# Patient Record
Sex: Female | Born: 1970 | Race: White | Hispanic: No | Marital: Married | Smoking: Never smoker
Health system: Southern US, Community
[De-identification: ages and names within clinical notes are randomized; demographics above are authoritative.]

## PROBLEM LIST (undated history)

## (undated) DIAGNOSIS — E039 Hypothyroidism, unspecified: Secondary | ICD-10-CM

## (undated) DIAGNOSIS — Z8742 Personal history of other diseases of the female genital tract: Secondary | ICD-10-CM

## (undated) DIAGNOSIS — N6009 Solitary cyst of unspecified breast: Secondary | ICD-10-CM

## (undated) HISTORY — DX: Personal history of other diseases of the female genital tract: Z87.42

## (undated) HISTORY — PX: APPENDECTOMY: SHX54

## (undated) HISTORY — DX: Hypothyroidism, unspecified: E03.9

## (undated) HISTORY — DX: Solitary cyst of unspecified breast: N60.09

---

## 2000-08-05 ENCOUNTER — Encounter: Payer: Self-pay | Admitting: Neurosurgery

## 2000-08-05 ENCOUNTER — Ambulatory Visit (HOSPITAL_COMMUNITY): Admission: RE | Admit: 2000-08-05 | Discharge: 2000-08-05 | Payer: Self-pay | Admitting: Neurosurgery

## 2006-12-06 ENCOUNTER — Encounter: Admission: RE | Admit: 2006-12-06 | Discharge: 2006-12-06 | Payer: Self-pay | Admitting: Obstetrics & Gynecology

## 2007-06-11 ENCOUNTER — Ambulatory Visit (HOSPITAL_COMMUNITY): Admission: RE | Admit: 2007-06-11 | Discharge: 2007-06-11 | Payer: Self-pay | Admitting: Obstetrics & Gynecology

## 2008-01-14 ENCOUNTER — Encounter: Admission: RE | Admit: 2008-01-14 | Discharge: 2008-01-14 | Payer: Self-pay | Admitting: Obstetrics & Gynecology

## 2008-01-18 ENCOUNTER — Other Ambulatory Visit: Admission: RE | Admit: 2008-01-18 | Discharge: 2008-01-18 | Payer: Self-pay | Admitting: Obstetrics and Gynecology

## 2008-01-23 ENCOUNTER — Ambulatory Visit (HOSPITAL_COMMUNITY): Admission: RE | Admit: 2008-01-23 | Discharge: 2008-01-23 | Payer: Self-pay | Admitting: Obstetrics & Gynecology

## 2008-06-02 ENCOUNTER — Encounter: Admission: RE | Admit: 2008-06-02 | Discharge: 2008-06-02 | Payer: Self-pay | Admitting: Obstetrics & Gynecology

## 2008-09-29 ENCOUNTER — Ambulatory Visit (HOSPITAL_COMMUNITY): Admission: RE | Admit: 2008-09-29 | Discharge: 2008-09-29 | Payer: Self-pay | Admitting: Obstetrics & Gynecology

## 2009-03-30 ENCOUNTER — Ambulatory Visit (HOSPITAL_COMMUNITY): Admission: RE | Admit: 2009-03-30 | Discharge: 2009-03-30 | Payer: Self-pay | Admitting: Endocrinology

## 2009-04-09 ENCOUNTER — Ambulatory Visit (HOSPITAL_COMMUNITY): Admission: RE | Admit: 2009-04-09 | Discharge: 2009-04-09 | Payer: Self-pay | Admitting: Obstetrics & Gynecology

## 2009-04-09 ENCOUNTER — Other Ambulatory Visit: Admission: RE | Admit: 2009-04-09 | Discharge: 2009-04-09 | Payer: Self-pay | Admitting: Obstetrics and Gynecology

## 2009-04-15 ENCOUNTER — Ambulatory Visit (HOSPITAL_COMMUNITY): Admission: RE | Admit: 2009-04-15 | Discharge: 2009-04-15 | Payer: Self-pay | Admitting: Pulmonary Disease

## 2009-05-13 ENCOUNTER — Ambulatory Visit (HOSPITAL_COMMUNITY): Admission: RE | Admit: 2009-05-13 | Discharge: 2009-05-13 | Payer: Self-pay | Admitting: Pulmonary Disease

## 2009-06-03 ENCOUNTER — Encounter: Admission: RE | Admit: 2009-06-03 | Discharge: 2009-06-03 | Payer: Self-pay | Admitting: Obstetrics & Gynecology

## 2010-02-24 ENCOUNTER — Encounter: Admission: RE | Admit: 2010-02-24 | Discharge: 2010-02-24 | Payer: Self-pay | Admitting: Endocrinology

## 2010-04-30 ENCOUNTER — Other Ambulatory Visit: Admission: RE | Admit: 2010-04-30 | Discharge: 2010-04-30 | Payer: Self-pay | Admitting: Obstetrics and Gynecology

## 2010-06-03 ENCOUNTER — Encounter (INDEPENDENT_AMBULATORY_CARE_PROVIDER_SITE_OTHER): Payer: Self-pay | Admitting: Cardiology

## 2010-06-03 ENCOUNTER — Ambulatory Visit (HOSPITAL_COMMUNITY): Admission: RE | Admit: 2010-06-03 | Discharge: 2010-06-03 | Payer: Self-pay | Admitting: Cardiology

## 2010-07-13 ENCOUNTER — Encounter: Admission: RE | Admit: 2010-07-13 | Discharge: 2010-07-13 | Payer: Self-pay | Admitting: Obstetrics & Gynecology

## 2010-09-06 ENCOUNTER — Encounter: Payer: Self-pay | Admitting: Endocrinology

## 2010-11-19 LAB — BLOOD GAS, ARTERIAL
Bicarbonate: 22.5 mEq/L (ref 20.0–24.0)
FIO2: 21 %
O2 Content: 21 L/min
O2 Saturation: 97.1 %
TCO2: 19.9 mmol/L (ref 0–100)
pCO2 arterial: 34.8 mmHg — ABNORMAL LOW (ref 35.0–45.0)
pH, Arterial: 7.427 — ABNORMAL HIGH (ref 7.350–7.400)
pO2, Arterial: 89 mmHg (ref 80.0–100.0)

## 2010-12-31 NOTE — Procedures (Signed)
NAMESARENA, JEZEK               ACCOUNT NO.:  [id number]   MEDICAL RECORD NO.:  [id number]          PATIENT TYPE:  OUT   LOCATION:  RESP                          FACILITY:  [facility]   PHYSICIAN:  Edward L. Juanetta Gosling, M.D.DATE OF BIRTH:  May 02, 1971   DATE OF PROCEDURE:  DATE OF DISCHARGE:                            PULMONARY FUNCTION TEST   1. Spirometry shows minimal airflow obstruction in the smaller      airways.  There is no ventilatory defect.  2. Lung volumes are normal.  3. DLCO is normal.  4. Arterial blood gases normal.      Edward L. Juanetta Gosling, M.D.  Electronically Signed     [facility]/MEDQ  D:  04/16/2009  T:  04/16/2009  Job:  [id number]   cc:   Cyril Mourning, NP  [facility]

## 2011-03-08 ENCOUNTER — Other Ambulatory Visit (HOSPITAL_COMMUNITY): Payer: Self-pay | Admitting: Family Medicine

## 2011-03-08 DIAGNOSIS — E042 Nontoxic multinodular goiter: Secondary | ICD-10-CM

## 2011-03-08 DIAGNOSIS — K829 Disease of gallbladder, unspecified: Secondary | ICD-10-CM

## 2011-03-14 ENCOUNTER — Ambulatory Visit (HOSPITAL_COMMUNITY)
Admission: RE | Admit: 2011-03-14 | Discharge: 2011-03-14 | Disposition: A | Discharge status: Home / Self Care | Payer: BC Managed Care – PPO | Source: Ambulatory Visit | Attending: Family Medicine | Admitting: Family Medicine

## 2011-03-14 DIAGNOSIS — R1011 Right upper quadrant pain: Secondary | ICD-10-CM | POA: Insufficient documentation

## 2011-03-14 DIAGNOSIS — K829 Disease of gallbladder, unspecified: Secondary | ICD-10-CM

## 2011-03-14 DIAGNOSIS — E042 Nontoxic multinodular goiter: Secondary | ICD-10-CM

## 2011-03-14 DIAGNOSIS — K824 Cholesterolosis of gallbladder: Secondary | ICD-10-CM | POA: Insufficient documentation

## 2011-03-15 ENCOUNTER — Other Ambulatory Visit: Payer: Self-pay | Admitting: Obstetrics & Gynecology

## 2011-03-15 DIAGNOSIS — N644 Mastodynia: Secondary | ICD-10-CM

## 2011-03-22 ENCOUNTER — Ambulatory Visit
Admission: RE | Admit: 2011-03-22 | Discharge: 2011-03-22 | Disposition: A | Discharge status: Home / Self Care | Payer: BC Managed Care – PPO | Source: Ambulatory Visit | Attending: Obstetrics & Gynecology | Admitting: Obstetrics & Gynecology

## 2011-03-22 DIAGNOSIS — N644 Mastodynia: Secondary | ICD-10-CM

## 2011-05-02 ENCOUNTER — Other Ambulatory Visit: Payer: Self-pay | Admitting: Adult Health

## 2011-05-11 ENCOUNTER — Other Ambulatory Visit (HOSPITAL_COMMUNITY)
Admission: RE | Admit: 2011-05-11 | Discharge: 2011-05-11 | Disposition: A | Discharge status: Home / Self Care | Payer: BC Managed Care – PPO | Source: Ambulatory Visit | Attending: Obstetrics and Gynecology | Admitting: Obstetrics and Gynecology

## 2011-05-11 ENCOUNTER — Other Ambulatory Visit: Payer: Self-pay | Admitting: Adult Health

## 2011-07-04 ENCOUNTER — Other Ambulatory Visit: Payer: Self-pay | Admitting: Obstetrics & Gynecology

## 2011-07-04 DIAGNOSIS — Z1231 Encounter for screening mammogram for malignant neoplasm of breast: Secondary | ICD-10-CM

## 2011-07-10 IMAGING — CT CT CHEST W/O CM
2 of 4 series · 15 of 36 positions shown, 18 images · non-contrast
Comparison: Chest radiograph 04/09/2009

CLINICAL DATA: Chest pain.  Chronic cough.  History sinusitis.

CT CHEST WITHOUT CONTRAST
TECHNIQUE: Multidetector CT imaging of the chest was performed
following the standard protocol without IV contrast.

[Series 3: chestroutine 5.0 b40f · axial · 0.61mm/px · z∈[+16,+312]mm · 12 of 65 slices shown, 15 images]
[im 3/65  mediastinal]
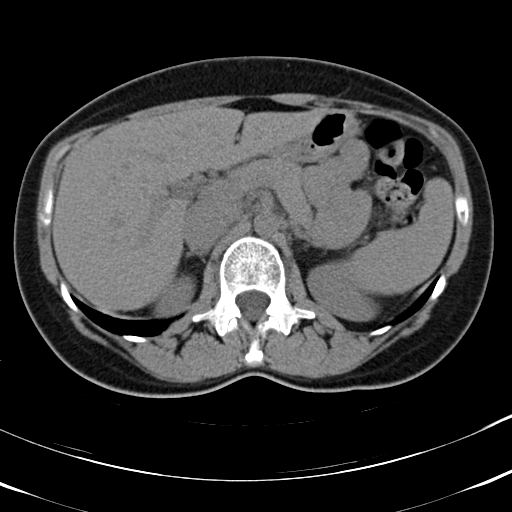
[im 3/65  lung]
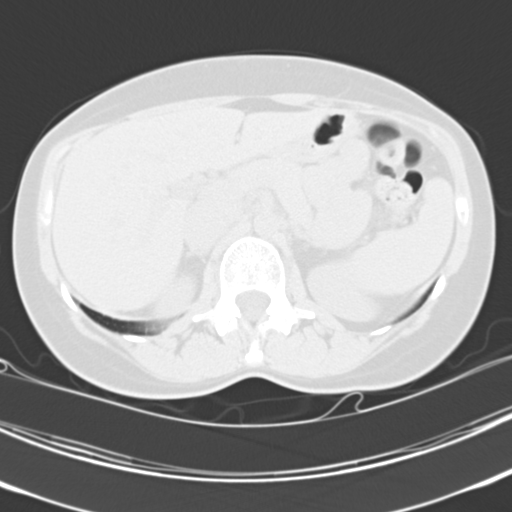
[im 9/65  lung]
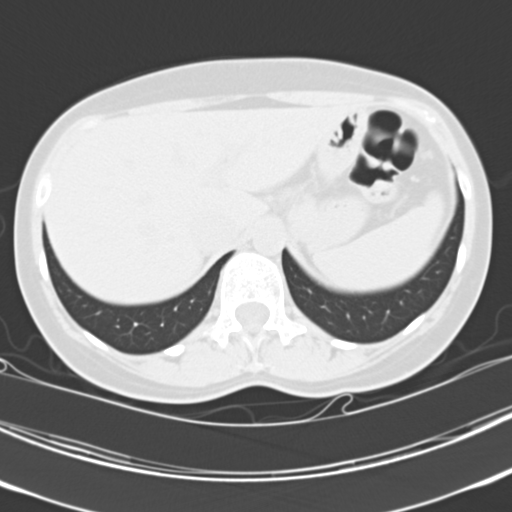
[im 14/65  lung]
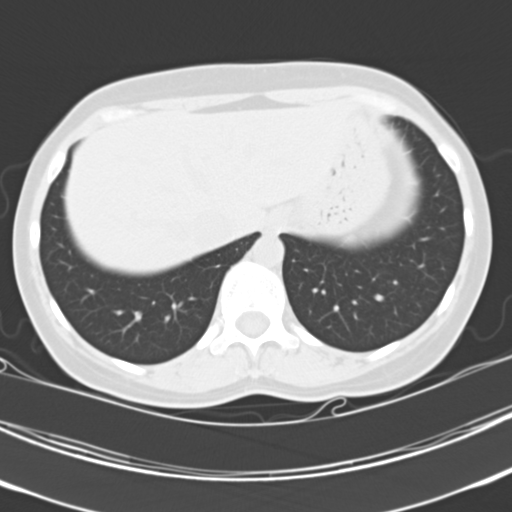
[im 20/65  lung]
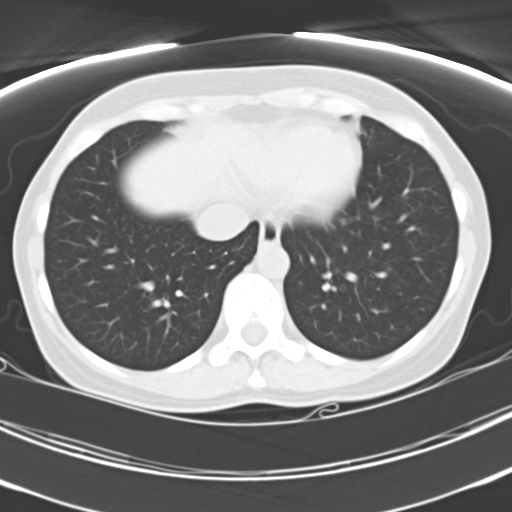
[im 26/65  mediastinal]
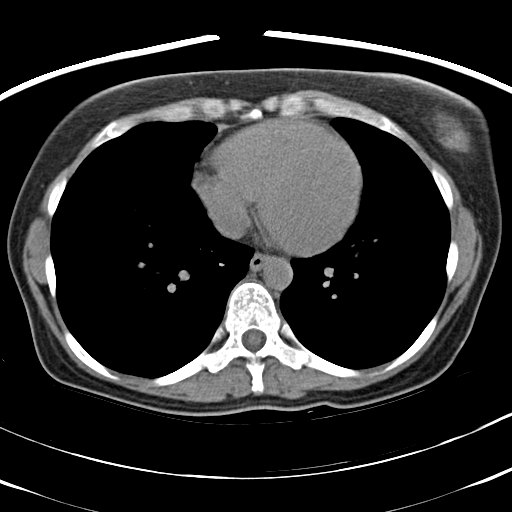
[im 26/65  lung]
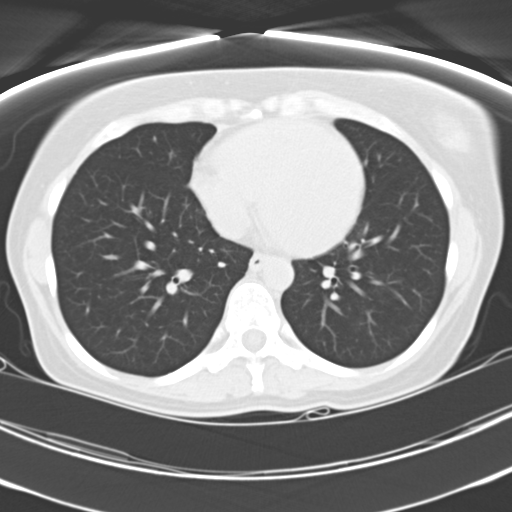
[im 31/65  lung]
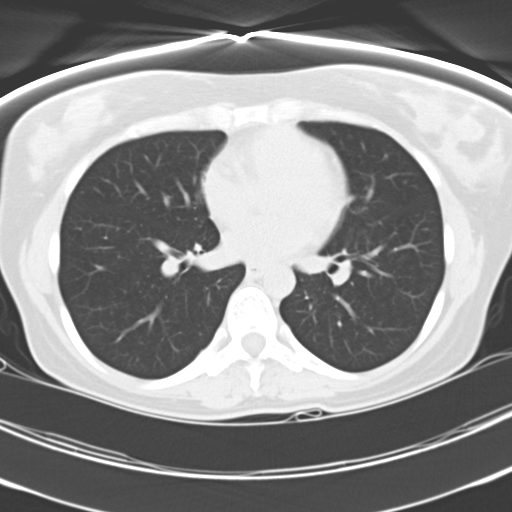
[im 34/65  lung]
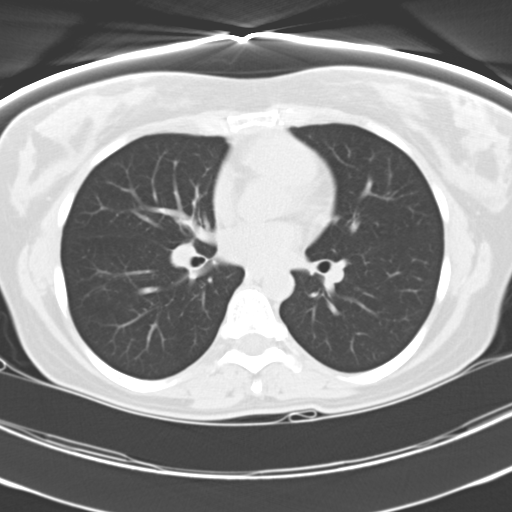
[im 39/65  lung]
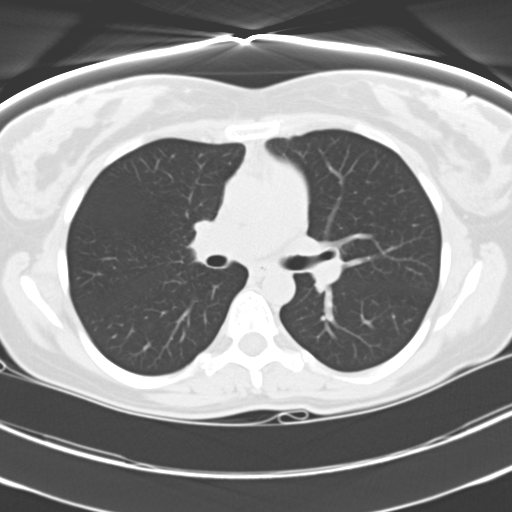
[im 45/65  mediastinal]
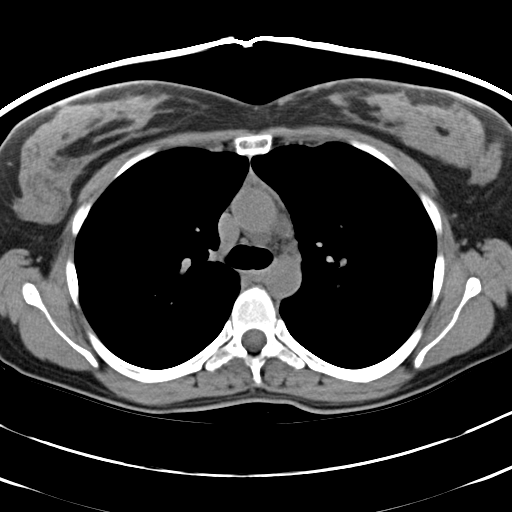
[im 45/65  lung]
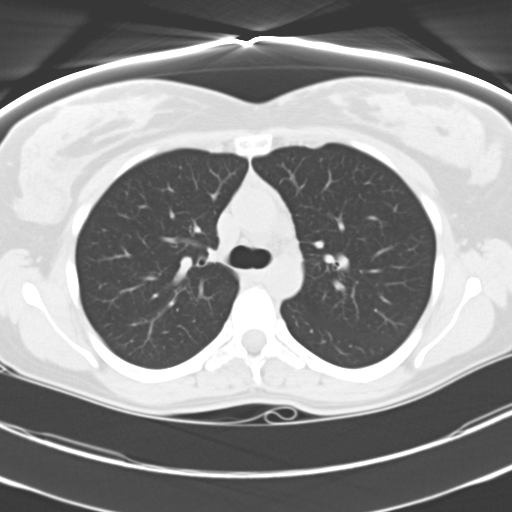
[im 51/65  lung]
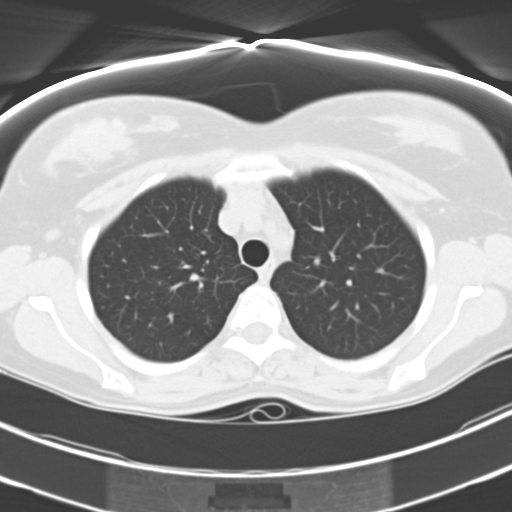
[im 56/65  lung]
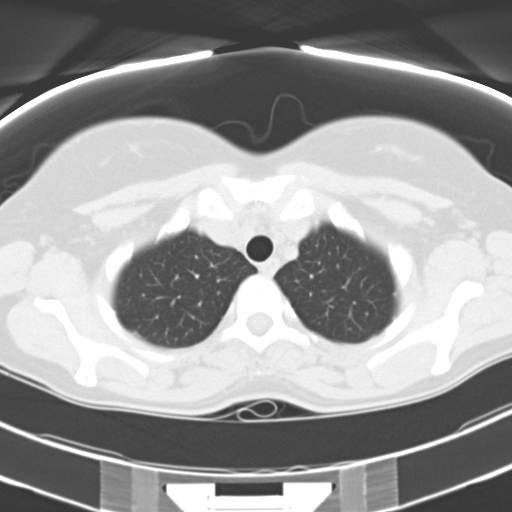
[im 62/65  lung]
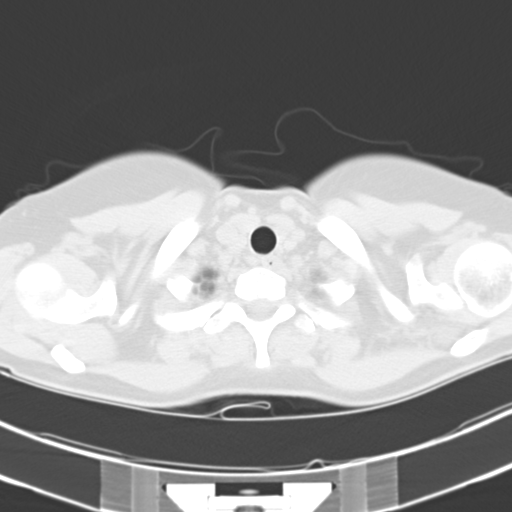

[Series 5: mpr coro 3mm · coronal · 0.61mm/px · 3 of 67 slices shown]
[im 14/67  lung]
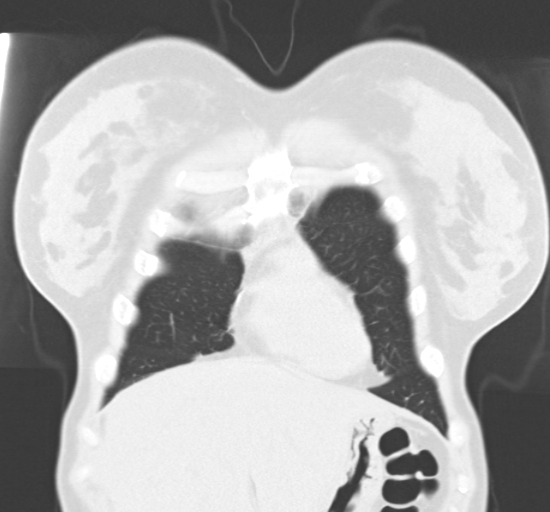
[im 27/67  lung]
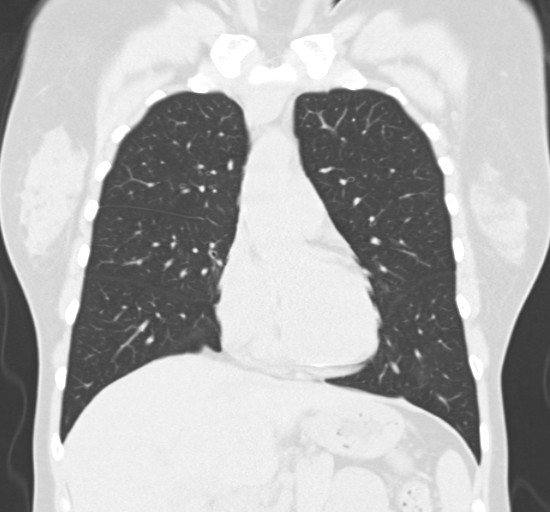
[im 40/67  lung]
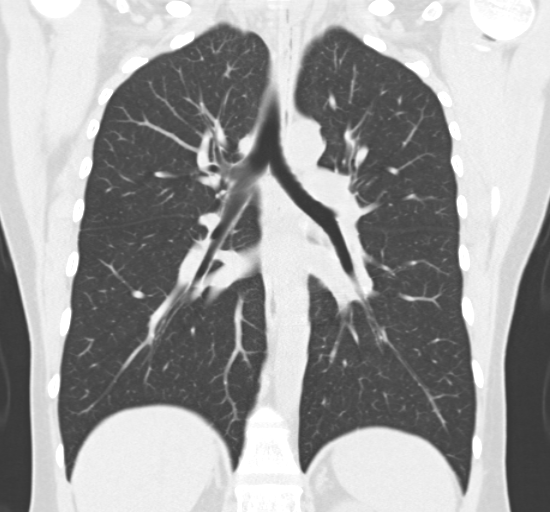

[15 of 36 positions shown; findings below may reference images not displayed]

FINDINGS: No pulmonary nodules or masses.  No infiltrates.  No
evidence for interstitial or airspace disease.  Slightly prominent
sized lymph nodes in the right axilla.  There are no nodes which
are pathologically enlarged.  No mediastinal adenopathy.  No
bronchiectasis
IMPRESSION: Unremarkable CT chest.  See comments above.

## 2011-08-01 ENCOUNTER — Ambulatory Visit: Payer: BC Managed Care – PPO

## 2011-08-23 ENCOUNTER — Ambulatory Visit
Admission: RE | Admit: 2011-08-23 | Discharge: 2011-08-23 | Disposition: A | Discharge status: Home / Self Care | Payer: BC Managed Care – PPO | Source: Ambulatory Visit | Attending: Obstetrics & Gynecology | Admitting: Obstetrics & Gynecology

## 2011-08-23 DIAGNOSIS — Z1231 Encounter for screening mammogram for malignant neoplasm of breast: Secondary | ICD-10-CM

## 2012-09-10 ENCOUNTER — Other Ambulatory Visit: Payer: Self-pay | Admitting: Obstetrics & Gynecology

## 2012-09-10 DIAGNOSIS — Z1231 Encounter for screening mammogram for malignant neoplasm of breast: Secondary | ICD-10-CM

## 2012-10-03 ENCOUNTER — Ambulatory Visit: Payer: BC Managed Care – PPO

## 2012-10-09 ENCOUNTER — Ambulatory Visit
Admission: RE | Admit: 2012-10-09 | Discharge: 2012-10-09 | Disposition: A | Discharge status: Home / Self Care | Payer: BC Managed Care – PPO | Source: Ambulatory Visit | Attending: Obstetrics & Gynecology | Admitting: Obstetrics & Gynecology

## 2012-10-09 DIAGNOSIS — Z1231 Encounter for screening mammogram for malignant neoplasm of breast: Secondary | ICD-10-CM

## 2012-12-05 ENCOUNTER — Encounter: Payer: Self-pay | Admitting: *Deleted

## 2012-12-05 DIAGNOSIS — E039 Hypothyroidism, unspecified: Secondary | ICD-10-CM

## 2012-12-06 ENCOUNTER — Ambulatory Visit (INDEPENDENT_AMBULATORY_CARE_PROVIDER_SITE_OTHER): Payer: BC Managed Care – PPO | Admitting: Adult Health

## 2012-12-06 ENCOUNTER — Other Ambulatory Visit (HOSPITAL_COMMUNITY)
Admission: RE | Admit: 2012-12-06 | Discharge: 2012-12-06 | Disposition: A | Discharge status: Home / Self Care | Payer: BC Managed Care – PPO | Source: Ambulatory Visit | Attending: Adult Health | Admitting: Adult Health

## 2012-12-06 ENCOUNTER — Encounter: Payer: Self-pay | Admitting: Adult Health

## 2012-12-06 VITALS — BP 112/70 | HR 72 | Ht 63.0 in | Wt 128.0 lb

## 2012-12-06 DIAGNOSIS — Z1212 Encounter for screening for malignant neoplasm of rectum: Secondary | ICD-10-CM

## 2012-12-06 DIAGNOSIS — Z01419 Encounter for gynecological examination (general) (routine) without abnormal findings: Secondary | ICD-10-CM | POA: Insufficient documentation

## 2012-12-06 DIAGNOSIS — Z0000 Encounter for general adult medical examination without abnormal findings: Secondary | ICD-10-CM

## 2012-12-06 DIAGNOSIS — E039 Hypothyroidism, unspecified: Secondary | ICD-10-CM

## 2012-12-06 DIAGNOSIS — R87810 Cervical high risk human papillomavirus (HPV) DNA test positive: Secondary | ICD-10-CM | POA: Insufficient documentation

## 2012-12-06 DIAGNOSIS — Z1151 Encounter for screening for human papillomavirus (HPV): Secondary | ICD-10-CM | POA: Insufficient documentation

## 2012-12-06 LAB — HEMOCCULT GUIAC POC 1CARD (OFFICE): Fecal Occult Blood, POC: NEGATIVE

## 2012-12-06 NOTE — Patient Instructions (Addendum)
Return in 1 year for physical  Mammogram yearly Colonoscopy at 48  Sign up for my chart

## 2012-12-06 NOTE — Progress Notes (Signed)
Patient ID: Ashley Jarvis, female   DOB: 1970/10/14, 42 y.o.   MRN: [id number] History of Present Illness: Ashley Jarvis is a 42 year old white female, married in for her pap and physical. She lost in Mom in November 2013, and has some sad days. Her husband and children are good, her son goes to middle school next year. She had labs with PCP and had 3 D mammogram this year.  Current Medications, Allergies, Past Medical History, Past Surgical History, Family History and Social History were reviewed in [employer] record.    Review of Systems:Patient denies any headaches, blurred vision, shortness of breath, chest pain, abdominal pain, problems with bowel movements, urination, or intercourse. No musculoskeletal pain or swelling.No mood changes. She uses condoms.  Physical Exam: Blood pressure 112/70, pulse 72, height 5\' 3"  (1.6 m), weight 128 lb (58.06 kg), last menstrual period 11/17/2012. General:  Well developed, well nourished, no acute distress Skin:  Warm and dry Neck:  Midline trachea, normal thyroid Lungs; Clear to auscultation bilaterally Breast:  No dominant palpable mass, retraction, or nipple discharge Cardiovascular: Regular rate and rhythm Abdomen:  Soft, non tender, no hepatosplenomegaly Pelvic:  External genitalia is normal in appearance.  The vagina is normal in appearance. The cervix is bulbous. Pap performed with HPV.  Uterus is felt to be normal size, shape, and contour.  No adnexal masses or tenderness noted. Rectal: Good sphincter tone, no polyps, or hemorrhoids felt.  Hemoccult negative. Extremities:  No swelling or varicosities noted Psych:  No mood changes, alert and cooperative  Impression: Yearly exam Hypothyroidism  Plan: Follow up 1 year for physical Mammogram yearly Colonoscopy at 48  Call any problems or questions

## 2012-12-14 ENCOUNTER — Telehealth: Payer: Self-pay | Admitting: Adult Health

## 2012-12-14 NOTE — Telephone Encounter (Signed)
Pt. Aware of abnormal pap ASCUS +HPV, will make colpo appt with Dr. Despina Hidden.

## 2012-12-17 ENCOUNTER — Ambulatory Visit (INDEPENDENT_AMBULATORY_CARE_PROVIDER_SITE_OTHER): Payer: BC Managed Care – PPO | Admitting: Obstetrics & Gynecology

## 2012-12-17 ENCOUNTER — Encounter: Payer: Self-pay | Admitting: Obstetrics & Gynecology

## 2012-12-17 VITALS — BP 100/80 | Ht 63.0 in | Wt 127.0 lb

## 2012-12-17 DIAGNOSIS — R87810 Cervical high risk human papillomavirus (HPV) DNA test positive: Secondary | ICD-10-CM

## 2012-12-17 DIAGNOSIS — R87610 Atypical squamous cells of undetermined significance on cytologic smear of cervix (ASC-US): Secondary | ICD-10-CM

## 2012-12-17 DIAGNOSIS — N879 Dysplasia of cervix uteri, unspecified: Secondary | ICD-10-CM

## 2012-12-17 NOTE — Patient Instructions (Addendum)
Cervical Dysplasia Cervical dysplasia is a condition in which a woman has abnormal changes in the cells of her cervix. The cervix is the opening to the uterus (womb) between the vagina and the uterus. These changes are called cervical dysplasia and may be the first signs of cervical cancer. These cells can be taken from the cervix during a Pap test and then looked at under a microscope. With early detection, treatment, and close follow-up care, nearly all cervical dysplasia can be cured. If untreated, the mild to moderate stages of dysplasia often grow more severe.  RISK FACTORS  The following increase the risk for cervical dysplasia.  Having had a sexually transmitted disease, including:  Chlamydia.  Human papilloma virus (HPV).  Becoming sexually active before age 18.  Having had more than 1 sexual partner.  Not using protection, such as condoms, during sexual intercourse, especially with new sexual partners.  Having had cancer of the vagina or vulva.  Having a sexual partner whose previous partner had cancer of the cervix or cervical dysplasia.  Having a sexual partner who has or has had cancer of the penis.  Having a weakened immune system (HIV, organ transplant).  Being the daughter of a woman who took DES (diethylstilbestrol) during pregnancy.  A history of cervical cancer in a woman's sister or mother.  Smoking.  Having had an abnormal Pap test in the past. SYMPTOMS  There are usually no symptoms. If there are symptoms, they may be vague such as:  Abnormal vaginal discharge.  Bleeding between periods or following intercourse.  Bleeding during menopause.  Pain on intercourse (dyspareunia). DIAGNOSIS   The Pap test is the best way of detecting abnormalities of the cervix.  Biopsy (removing a piece of tissue to look at under the microscope) of the cervix when the Pap test is abnormal or when the Pap test is normal, but the cervix looks abnormal. TREATMENT    Catching and treating the changes early with Pap tests can prevent cervical cancer.  Cryotherapy freezes the abnormal cells with a steel tip instrument.  A laser can be used to remove the abnormal cells.  Loop electrocautery excision procedure (LEEP). This procedure uses a heated electrical loop to remove a cone-like portion of the cervix, including the cervical canal.  For more serious cases of cervical dysplasia, the abnormal tissue may be removed surgically by:  A cone biopsy (by cold knife, laser or LEEP). A procedure in which a portion of the center of the cervix with the cervical canal is removed.  The uterus and cervix are removed (hysterectomy). Your caregiver will advise you regarding the need and timing of Pap tests in your follow-up. Women who have been treated for dysplasia should be closely followed with pelvic exams and Pap tests. During the first year following treatment of cervical dysplasia, Pap tests should be done every 3 to 4 months. In the second year, the schedule is every 6 months, or as recommended by your caregiver. See your caregiver for new or worsening problems. HOME CARE INSTRUCTIONS   Follow the instructions and recommendations of your caregiver regarding medicines and follow-up appointments.  Only take over-the-counter or prescription medicines for pain or discomfort as directed by your caregiver.  Cramping and pelvic discomfort may follow cryotherapy. It is not abnormal to have watery discharge for several weeks after.  Laser, cone surgery, cryotherapy or LEEP can cause a bad smelling vaginal discharge. It may also cause vaginal bleeding for a couple weeks following the procedure. The   discharge may be black from the paste used to control bleeding from the cone site. This is normal.  Do not use tampons, have sexual intercourse or douche until your caregiver says it is okay. SEEK MEDICAL CARE IF:   You develop genital warts.  You need a prescription for  pain medicine following your treatment. SEEK IMMEDIATE MEDICAL CARE IF:   Your bleeding is heavier than a normal menstrual period.  You develop bright red bleeding, especially if you have blood clots.  You have a fever.  You have increasing cramps or pain not relieved with medicine.  You are lightheaded, unusually weak, or have fainting spells.  You have abnormal vaginal discharge.  You develop abdominal pain. PREVENTION   The surest way to prevent cervical dysplasia is to abstain from sexual intercourse.  Practice safe sex, use condoms and have only one sex partner who does not have other sex partners.  A Pap test is done to screen for cervical cancer.  The first Pap test should be done at age 21.  Between ages 21 and 29, Pap tests are repeated every 2 years.  Beginning at age 30, you are advised to have a Pap test every 3 years as long as your past 3 Pap tests have been normal.  Some women have medical problems that increase the chance of getting cervical cancer. Talk to your caregiver about these problems. It is especially important to talk to your caregiver if a new problem develops soon after your last Pap test. In these cases, your caregiver may recommend more frequent screening and Pap tests.  The above recommendations are the same for women who have or have not gotten the vaccine for HPV (Human Papillomavirus).  If you had a hysterectomy for a problem that was not a cancer or a condition that could lead to cancer, then you no longer need Pap tests. However, even if you no longer need a Pap test, a regular exam is a good idea to make sure no other problems are starting.   If you are between ages 65 and 70, and you have had normal Pap tests going back 10 years, you no longer need Pap tests. However, even if you no longer need a Pap test, a regular exam is a good idea to make sure no other problems are starting.   If you have had past treatment for cervical cancer or a  condition that could lead to cancer, you need Pap tests and screening for cancer for at least 20 years after your treatment.  If Pap tests have been discontinued, risk factors (such as a new sexual partner) need to be re-assessed to determine if screening should be resumed.  Some women may need screenings more often if they are at high risk for cervical cancer.  Your caregiver may do additional tests including:  Colposcopy. A procedure in which a special microscope magnifies the cells and allows the provider to closely examine the cervix, vagina, and vulva.  Biopsy. A small tissue sample is taken from the cervix, vagina or vulva. This is generally done in your caregivers office.  A cone biopsy (cold knife or laser). A large tissue sample is taken from the cervix. This procedure is usually done in an operating room under a general anesthetic. The cone often removes all abnormal tissue and so may also complete the treatment.  LEEP, also removing a circular portion of the cervix and is done in a doctors office under a local anesthetic.  Now   there is a vaccine, Gardasil, that was developed to prevent the HPV'S that can cause cancer of the cervix and genital warts. It is recommended for females ages 9 to 26. It should not be given to pregnant women until more is known about its effects on the fetus. Not all cancers of the cervix are caused by the HPV. Routine gynecology exams and Pap tests should continue as recommended by your caregiver. Document Released: 08/01/2005 Document Revised: 10/24/2011 Document Reviewed: 07/23/2008 ExitCare Patient Information 2013 [employer].  

## 2012-12-17 NOTE — Progress Notes (Signed)
Patient ID: Ashley Jarvis, female   DOB: Jan 09, 1971, 42 y.o.   MRN: [id number] Patient ID: Ashley Jarvis, female   DOB: Mar 28, 1971, 42 y.o.   MRN: [id number]  Chief Complaint  Patient presents with  . Abnormal Pap Smear    colpo    HPI Ashley Jarvis is a 42 y.o. female.  First abnormal pap ever  HPI  Indications: Pap smear on April 2014 showed: ASCUS with POSITIVE high risk HPV. Previous colposcopy: na. Prior cervical treatment: na.  Past Medical History  Diagnosis Date  . Hypothyroidism   . Breast cyst   . COPD (chronic obstructive pulmonary disease)     Past Surgical History  Procedure Laterality Date  . Appendectomy      Family History  Problem Relation Age of Onset  . Hypertension Other   . Stroke Other   . Cancer Other     lung, cervical   . Coronary artery disease Other   . Asthma Mother   . Coronary artery disease Paternal Grandfather   . Cancer Paternal Grandfather     bladder  . Cancer Paternal Grandmother     lung  . Coronary artery disease Paternal Grandmother   . Coronary artery disease Maternal Grandmother   . Coronary artery disease Maternal Grandfather   . Hypertension Father     Social History History  Substance Use Topics  . Smoking status: Never Smoker   . Smokeless tobacco: Never Used  . Alcohol Use: Yes     Comment: occ.    Allergies  Allergen Reactions  . Sulfa Antibiotics     Current Outpatient Prescriptions  Medication Sig Dispense Refill  . IRON, FERROUS GLUCONATE, PO Take 1 tablet by mouth daily.      . Magnesium Oxide (MAG-CAPS PO) Take 2 tablets by mouth daily.      . Nutritional Supplements (JUICE PLUS FIBRE PO) Take 1 tablet by mouth.      . Testosterone Propionate 2 % CREA Place 1 each onto the skin daily.      Marland Kitchen thyroid (ARMOUR THYROID) 60 MG tablet Take 60 mg by mouth daily.       No current facility-administered medications for this visit.    Review of Systems Review of Systems  Blood pressure 100/80,  height 5\' 3"  (1.6 m), weight 127 lb (57.607 kg), last menstrual period 12/17/2012.  Physical Exam Physical Exam  Data Reviewed Normal colposcopy HPV identification pending  Assessment    Procedure Details  The risks and benefits of the procedure and Written informed consent obtained.  Speculum placed in vagina and excellent visualization of cervix achieved, cervix swabbed x 3 with acetic acid solution.  Specimens: none  Complications: none.     Plan Follow up pap 1 year if HPV id negative ^ months if positive         EURE,LUTHER H 12/17/2012, 11:55 AM

## 2013-01-01 ENCOUNTER — Telehealth: Payer: Self-pay | Admitting: Adult Health

## 2013-01-01 NOTE — Telephone Encounter (Signed)
Does not have high risk HPV. Get pap next year.

## 2013-05-18 IMAGING — US US BREAST L
1 series · 5 of 5 positions shown · non-contrast
Comparison: 07/13/2010, 06/03/2009, 06/02/2008, 01/14/2008,
12/06/2006

CLINICAL DATA: The patient has been experiencing pain in the
medial aspect of the left breast. She states that she has and her
health care provider felt a round mass in the medial aspect of the
left breast near the sternum. She does not feel a mass today.

DIGITAL DIAGNOSTIC LEFT MAMMOGRAM WITH CAD AND LEFT BREAST
ULTRASOUND:

[Series 1: us breast left · 5 of 5 slices shown]
[im 1/5]
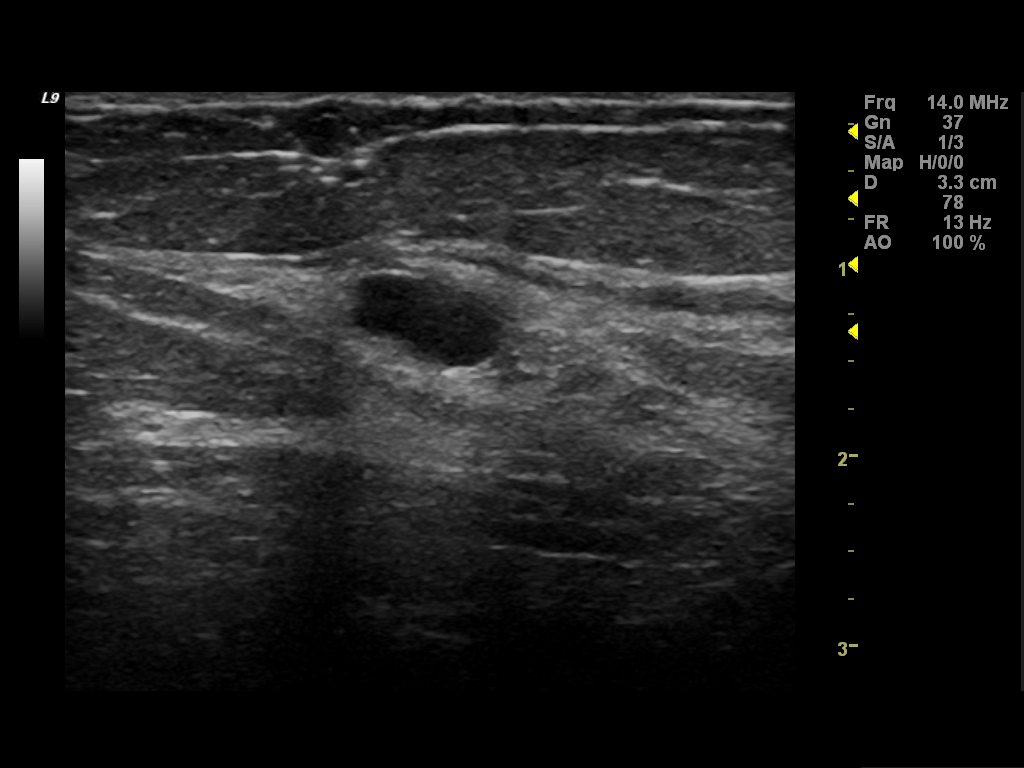
[im 2/5]
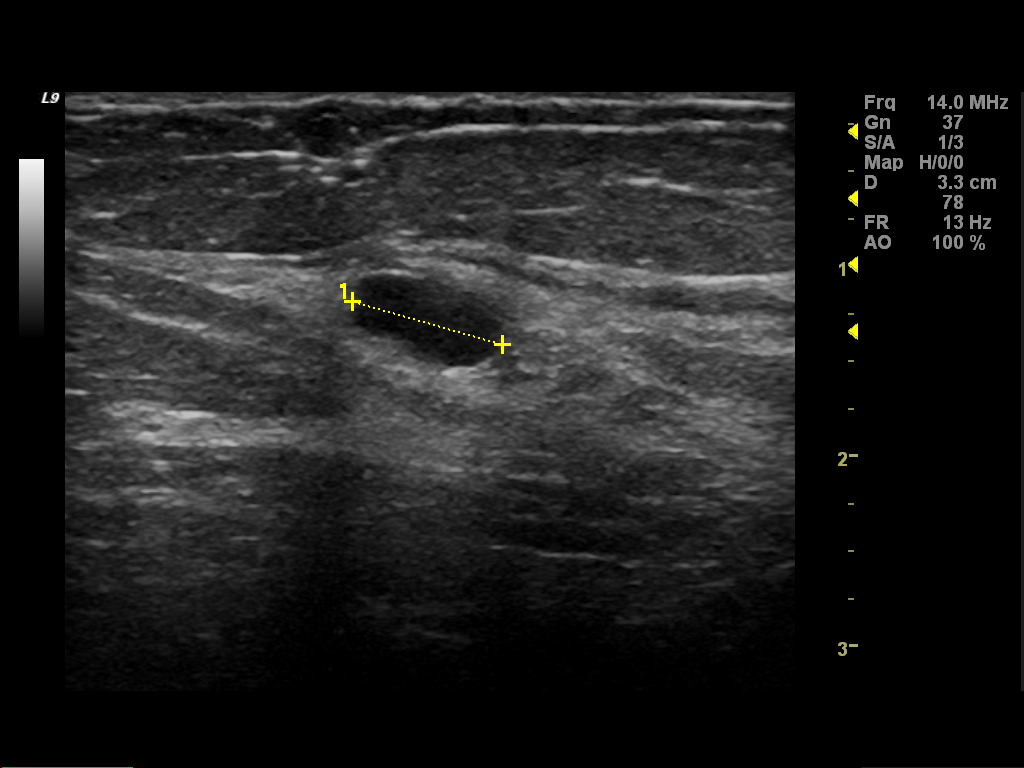
[im 3/5]
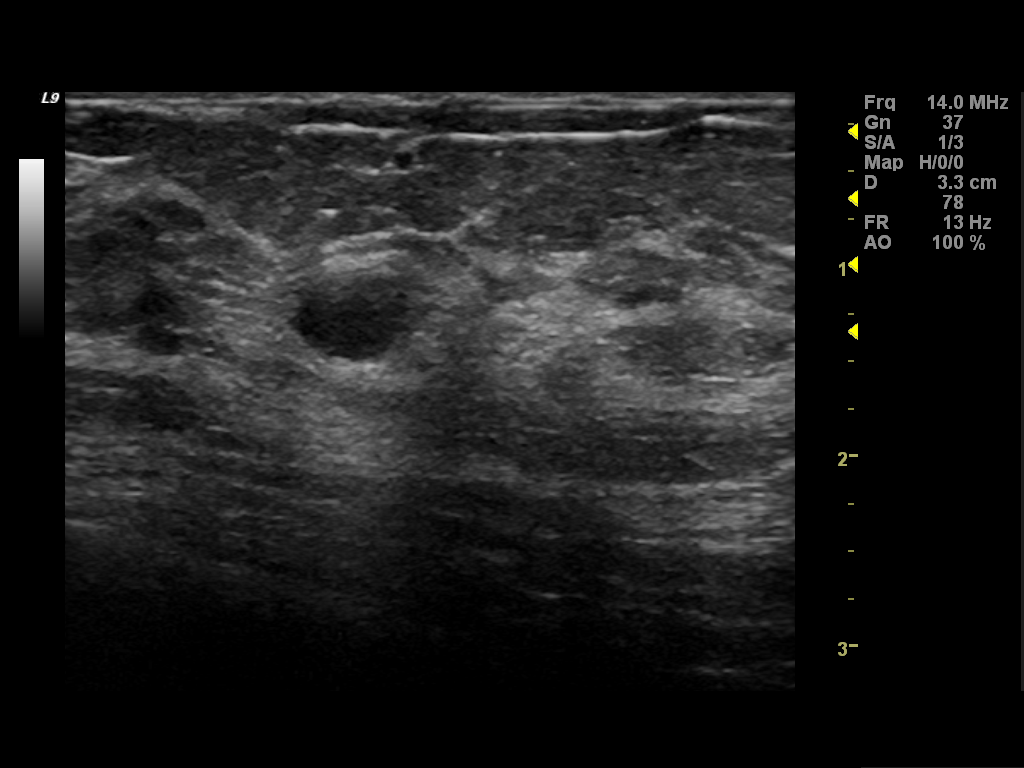
[im 4/5]
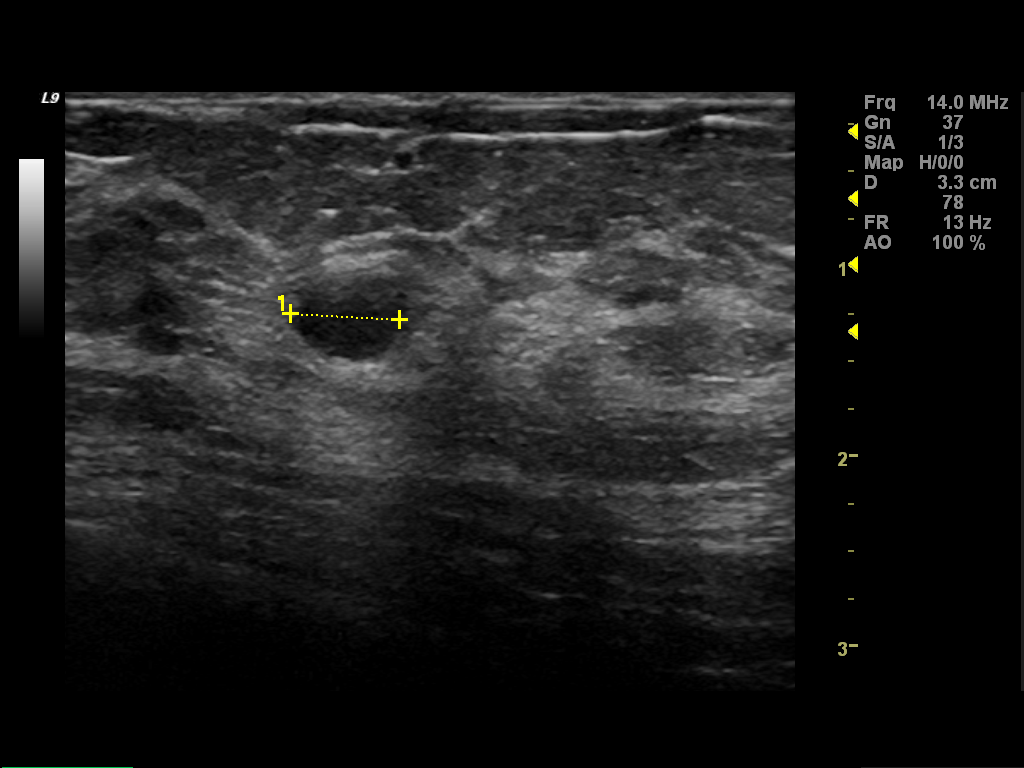
[im 5/5]
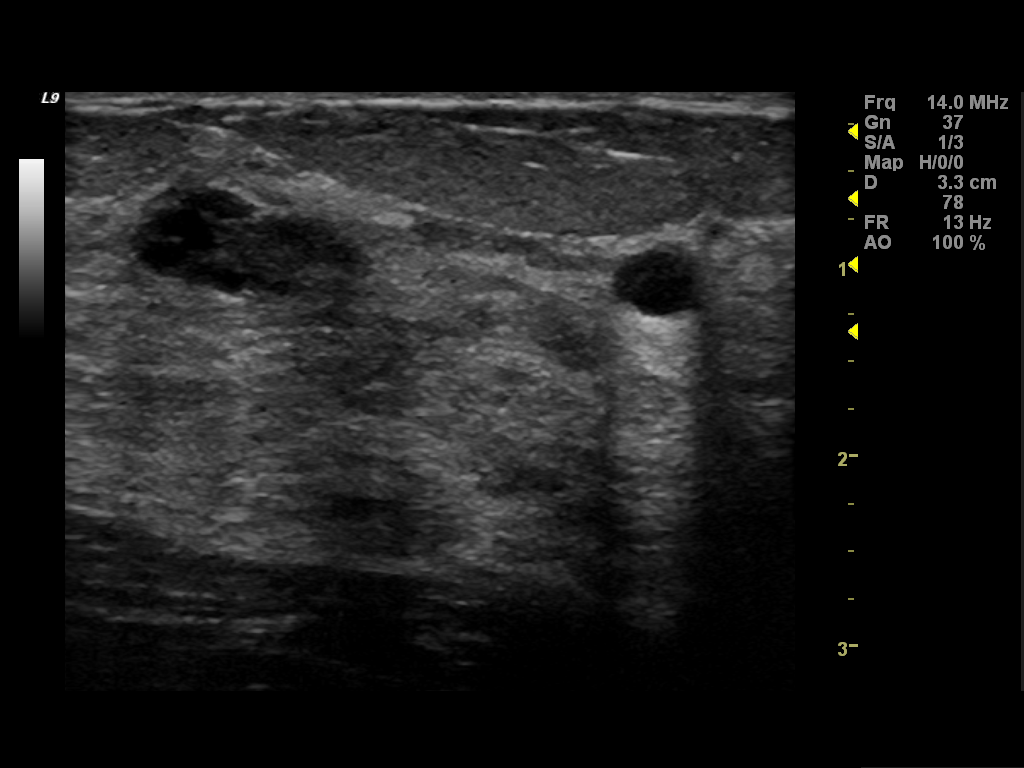

[5 of 5 positions shown; findings below may reference images not displayed]

FINDINGS: The breast tissue is heterogeneously dense.  There is no
dominant mass, architectural distortion or calcification to suggest
malignancy.  Parenchymal pattern is stable as compared to prior
studies.
Mammographic images were processed with CAD.

On physical exam, no mass is palpated in the medial aspect of the
left breast.

Ultrasound is performed, showing fatty tissue only in the far
medial portion of the left breast where the patient states she had
felt a mass.  There are multiple subcentimeter nonpalpable cysts
scattered throughout the breast parenchyma.  No solid mass,
distortion or shadowing to suggest malignancy is identified.
IMPRESSION: No mammographic or sonographic evidence of malignancy.  Yearly
screening mammography is suggested with next scheduled exam in
June 2011.

BI-RADS CATEGORY 1:  Negative.

## 2013-05-18 IMAGING — MG MM DIGITAL DIAGNOSTIC UNILAT L {BCG}
2 series · 2 of 2 positions shown · non-contrast
Comparison: 07/13/2010, 06/03/2009, 06/02/2008, 01/14/2008,
12/06/2006

CLINICAL DATA: The patient has been experiencing pain in the
medial aspect of the left breast. She states that she has and her
health care provider felt a round mass in the medial aspect of the
left breast near the sternum. She does not feel a mass today.

DIGITAL DIAGNOSTIC LEFT MAMMOGRAM WITH CAD AND LEFT BREAST
ULTRASOUND:

[L CC]
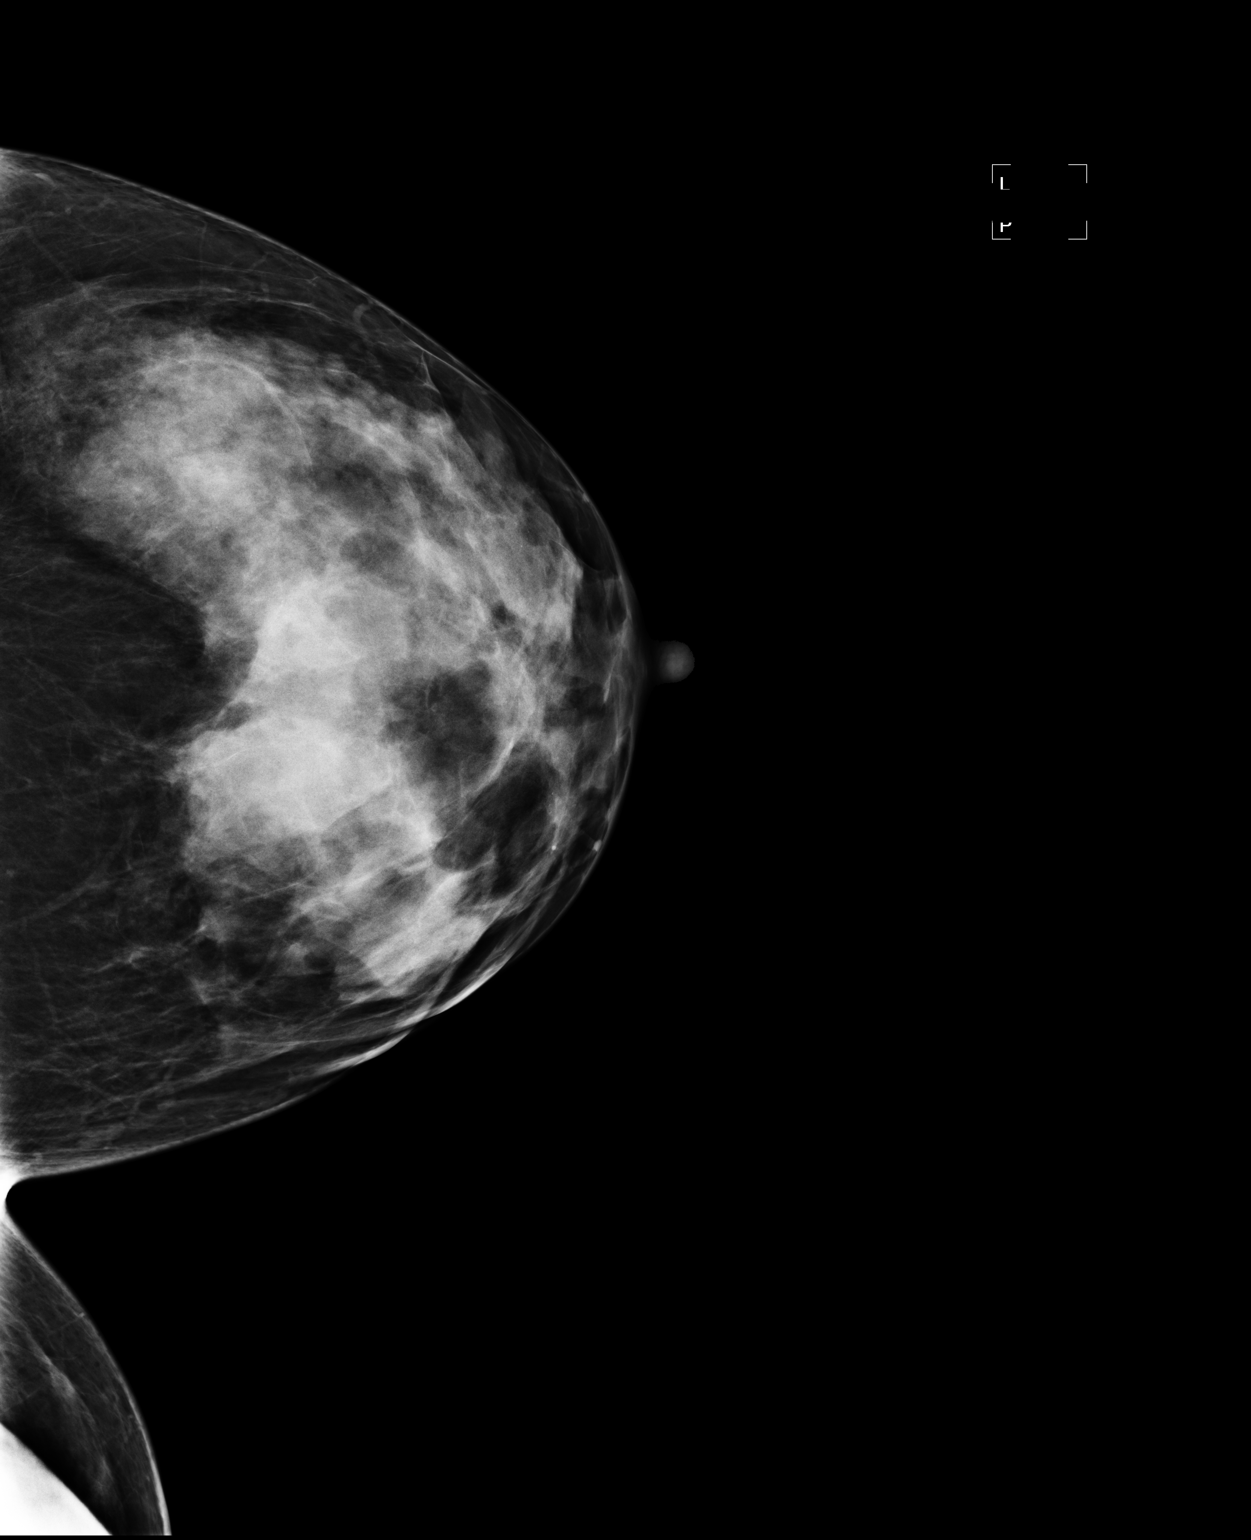

[L MLO]
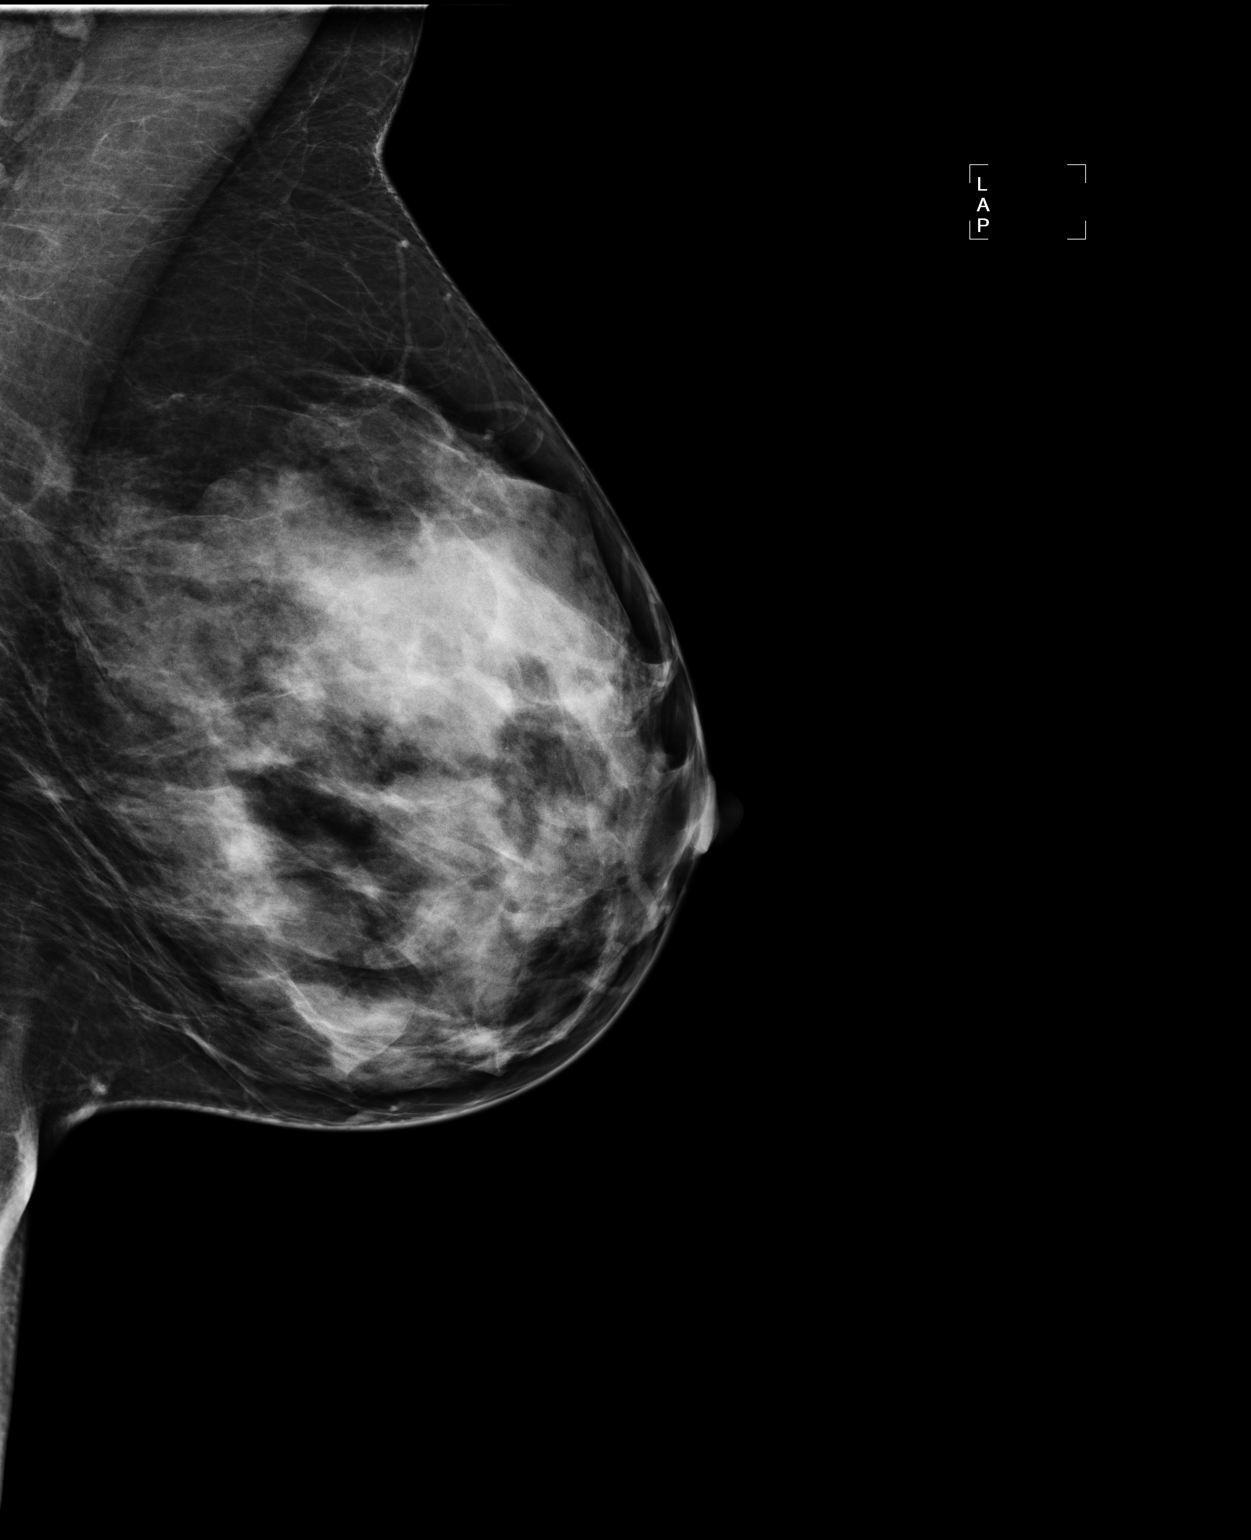

[2 of 2 positions shown; findings below may reference images not displayed]

FINDINGS: The breast tissue is heterogeneously dense.  There is no
dominant mass, architectural distortion or calcification to suggest
malignancy.  Parenchymal pattern is stable as compared to prior
studies.
Mammographic images were processed with CAD.

On physical exam, no mass is palpated in the medial aspect of the
left breast.

Ultrasound is performed, showing fatty tissue only in the far
medial portion of the left breast where the patient states she had
felt a mass.  There are multiple subcentimeter nonpalpable cysts
scattered throughout the breast parenchyma.  No solid mass,
distortion or shadowing to suggest malignancy is identified.
IMPRESSION: No mammographic or sonographic evidence of malignancy.  Yearly
screening mammography is suggested with next scheduled exam in
June 2011.

BI-RADS CATEGORY 1:  Negative.

## 2013-06-20 ENCOUNTER — Other Ambulatory Visit: Payer: Self-pay

## 2013-10-11 ENCOUNTER — Other Ambulatory Visit: Payer: Self-pay

## 2013-10-11 ENCOUNTER — Telehealth: Payer: Self-pay | Admitting: Adult Health

## 2013-10-11 ENCOUNTER — Ambulatory Visit (INDEPENDENT_AMBULATORY_CARE_PROVIDER_SITE_OTHER): Payer: BC Managed Care – PPO | Admitting: Adult Health

## 2013-10-11 ENCOUNTER — Encounter: Payer: Self-pay | Admitting: Adult Health

## 2013-10-11 VITALS — BP 98/68 | Ht 63.5 in | Wt 126.0 lb

## 2013-10-11 DIAGNOSIS — N63 Unspecified lump in unspecified breast: Secondary | ICD-10-CM

## 2013-10-11 DIAGNOSIS — J329 Chronic sinusitis, unspecified: Secondary | ICD-10-CM

## 2013-10-11 MED ORDER — AZITHROMYCIN 250 MG PO TABS: ORAL_TABLET | ORAL | Status: DC

## 2013-10-11 NOTE — Telephone Encounter (Signed)
Pt found lump in breast to come in at 11:30 today

## 2013-10-11 NOTE — Progress Notes (Signed)
Subjective:     Patient ID: Barron AlvineMelinda W Bucknam, female   DOB: Jan 02, 1971, 43 y.o.   MRN: [id number]  HPI Juliette AlcideMelinda is a 43 year old white female, married who called and was worked in for having found lump in left breast last night.Also complains of sinus pressure, is already using nettie pot.  Review of Systems See HPI Reviewed past medical,surgical, social and family history. Reviewed medications and allergies.     Objective:   Physical Exam BP 98/68  Ht 5' 3.5" (1.613 m)  Wt 126 lb (57.153 kg)  BMI 21.97 kg/m2  LMP 10/01/2013    Skin warm and dry,  Breasts:no dominate palpable mass, retraction or nipple discharge on right, on left has no retraction or nipple discharge, but has firm, mobile nodule at 11 o'clock 2 fb from areola about 1.5 x 1 cm,Has nasal congestion, pressure at sinuses and left ear red with +cervical lymph node, lungs sound clear.  Assessment:     Left breast nodule Sinus infection    Plan:     Scheduled diagnostic bilateral mamm[facility] and [facility] breast [facility] [place]  [facility] Follow up in 2 months for physical

## 2013-10-11 NOTE — Patient Instructions (Signed)
Follow up prn Get mammogram  At 3/10 at 8:30

## 2013-10-18 ENCOUNTER — Telehealth: Payer: Self-pay | Admitting: Adult Health

## 2013-10-18 ENCOUNTER — Ambulatory Visit
Admission: RE | Admit: 2013-10-18 | Discharge: 2013-10-18 | Disposition: A | Discharge status: Home / Self Care | Payer: BC Managed Care – PPO | Source: Ambulatory Visit | Attending: Adult Health | Admitting: Adult Health

## 2013-10-18 DIAGNOSIS — N63 Unspecified lump in unspecified breast: Secondary | ICD-10-CM

## 2013-10-18 NOTE — Telephone Encounter (Signed)
Pt aware of cysts on [place]

## 2013-10-22 ENCOUNTER — Other Ambulatory Visit: Payer: BC Managed Care – PPO

## 2013-12-10 ENCOUNTER — Other Ambulatory Visit: Payer: BC Managed Care – PPO | Admitting: Adult Health

## 2013-12-26 ENCOUNTER — Other Ambulatory Visit: Payer: BC Managed Care – PPO | Admitting: Adult Health

## 2014-01-24 ENCOUNTER — Emergency Department (HOSPITAL_COMMUNITY): Payer: BC Managed Care – PPO

## 2014-01-24 ENCOUNTER — Ambulatory Visit (HOSPITAL_COMMUNITY)
Admission: EM | Admit: 2014-01-24 | Discharge: 2014-01-24 | Disposition: A | Discharge status: Home / Self Care | Payer: BC Managed Care – PPO | Attending: Emergency Medicine | Admitting: Emergency Medicine

## 2014-01-24 ENCOUNTER — Encounter (HOSPITAL_COMMUNITY): Payer: BC Managed Care – PPO | Admitting: Anesthesiology

## 2014-01-24 ENCOUNTER — Emergency Department (HOSPITAL_COMMUNITY): Payer: BC Managed Care – PPO | Admitting: Anesthesiology

## 2014-01-24 ENCOUNTER — Encounter (HOSPITAL_COMMUNITY)
Admission: EM | Disposition: A | Discharge status: Home / Self Care | Payer: Self-pay | Source: Home / Self Care | Attending: Emergency Medicine

## 2014-01-24 ENCOUNTER — Encounter (HOSPITAL_COMMUNITY): Payer: Self-pay | Admitting: Emergency Medicine

## 2014-01-24 DIAGNOSIS — E039 Hypothyroidism, unspecified: Secondary | ICD-10-CM | POA: Insufficient documentation

## 2014-01-24 DIAGNOSIS — Y92009 Unspecified place in unspecified non-institutional (private) residence as the place of occurrence of the external cause: Secondary | ICD-10-CM | POA: Insufficient documentation

## 2014-01-24 DIAGNOSIS — L089 Local infection of the skin and subcutaneous tissue, unspecified: Secondary | ICD-10-CM

## 2014-01-24 DIAGNOSIS — Z882 Allergy status to sulfonamides status: Secondary | ICD-10-CM | POA: Insufficient documentation

## 2014-01-24 DIAGNOSIS — S61051A Open bite of right thumb without damage to nail, initial encounter: Secondary | ICD-10-CM

## 2014-01-24 DIAGNOSIS — W5501XA Bitten by cat, initial encounter: Secondary | ICD-10-CM

## 2014-01-24 DIAGNOSIS — IMO0001 Reserved for inherently not codable concepts without codable children: Secondary | ICD-10-CM | POA: Insufficient documentation

## 2014-01-24 DIAGNOSIS — M009 Pyogenic arthritis, unspecified: Secondary | ICD-10-CM | POA: Insufficient documentation

## 2014-01-24 HISTORY — PX: I & D EXTREMITY: SHX5045

## 2014-01-24 LAB — CBC WITH DIFFERENTIAL/PLATELET
Basophils Absolute: 0 10*3/uL (ref 0.0–0.1)
Basophils Relative: 0 % (ref 0–1)
Eosinophils Absolute: 0.2 10*3/uL (ref 0.0–0.7)
Eosinophils Relative: 2 % (ref 0–5)
HCT: 38.1 % (ref 36.0–46.0)
Hemoglobin: 12.5 g/dL (ref 12.0–15.0)
Lymphocytes Relative: 30 % (ref 12–46)
Lymphs Abs: 2.3 10*3/uL (ref 0.7–4.0)
MCH: 29 pg (ref 26.0–34.0)
MCHC: 32.8 g/dL (ref 30.0–36.0)
MCV: 88.4 fL (ref 78.0–100.0)
Monocytes Absolute: 0.6 10*3/uL (ref 0.1–1.0)
Monocytes Relative: 7 % (ref 3–12)
Neutro Abs: 4.8 10*3/uL (ref 1.7–7.7)
Neutrophils Relative %: 61 % (ref 43–77)
Platelets: 237 10*3/uL (ref 150–400)
RBC: 4.31 MIL/uL (ref 3.87–5.11)
RDW: 13.5 % (ref 11.5–15.5)
WBC: 7.9 10*3/uL (ref 4.0–10.5)

## 2014-01-24 LAB — BASIC METABOLIC PANEL
BUN: 11 mg/dL (ref 6–23)
CO2: 22 mEq/L (ref 19–32)
Calcium: 9.3 mg/dL (ref 8.4–10.5)
Chloride: 104 mEq/L (ref 96–112)
Creatinine, Ser: 0.56 mg/dL (ref 0.50–1.10)
GFR calc Af Amer: >90 mL/min (ref >90)
GFR calc non Af Amer: >90 mL/min (ref >90)
Glucose, Bld: 87 mg/dL (ref 70–99)
Potassium: 4.1 mEq/L (ref 3.7–5.3)
Sodium: 143 mEq/L (ref 137–147)

## 2014-01-24 LAB — GRAM STAIN

## 2014-01-24 SURGERY — IRRIGATION AND DEBRIDEMENT EXTREMITY
Anesthesia: General | Site: Thumb | Laterality: Right

## 2014-01-24 MED ORDER — FENTANYL CITRATE 0.05 MG/ML IJ SOLN
INTRAMUSCULAR | Status: DC | PRN
  Administered 2014-01-24 (×2): 25 ug via INTRAVENOUS
  Administered 2014-01-24: 100 ug via INTRAVENOUS

## 2014-01-24 MED ORDER — ARTIFICIAL TEARS OP OINT
TOPICAL_OINTMENT | OPHTHALMIC | Status: DC | PRN
  Administered 2014-01-24: 1 via OPHTHALMIC

## 2014-01-24 MED ORDER — LIDOCAINE HCL (CARDIAC) 20 MG/ML IV SOLN
INTRAVENOUS | Status: DC | PRN
  Administered 2014-01-24: 60 mg via INTRAVENOUS

## 2014-01-24 MED ORDER — PROPOFOL 10 MG/ML IV BOLUS
INTRAVENOUS | Status: AC
  Filled 2014-01-24: qty 20

## 2014-01-24 MED ORDER — SODIUM CHLORIDE 0.9 % IR SOLN
Status: DC | PRN
  Administered 2014-01-24: 1000 mL

## 2014-01-24 MED ORDER — LIDOCAINE HCL (CARDIAC) 20 MG/ML IV SOLN
INTRAVENOUS | Status: AC
  Filled 2014-01-24: qty 5

## 2014-01-24 MED ORDER — HYDROMORPHONE HCL PF 1 MG/ML IJ SOLN
0.2500 mg | INTRAMUSCULAR | Status: DC | PRN
  Administered 2014-01-24 (×2): 0.5 mg via INTRAVENOUS

## 2014-01-24 MED ORDER — HYDROMORPHONE HCL PF 1 MG/ML IJ SOLN
INTRAMUSCULAR | Status: AC
  Filled 2014-01-24: qty 1

## 2014-01-24 MED ORDER — FENTANYL CITRATE 0.05 MG/ML IJ SOLN
INTRAMUSCULAR | Status: AC
  Filled 2014-01-24: qty 5

## 2014-01-24 MED ORDER — BUPIVACAINE HCL (PF) 0.25 % IJ SOLN
INTRAMUSCULAR | Status: DC | PRN
  Administered 2014-01-24: 10 mL

## 2014-01-24 MED ORDER — PHENYLEPHRINE 40 MCG/ML (10ML) SYRINGE FOR IV PUSH (FOR BLOOD PRESSURE SUPPORT)
PREFILLED_SYRINGE | INTRAVENOUS | Status: AC
  Filled 2014-01-24: qty 10

## 2014-01-24 MED ORDER — MIDAZOLAM HCL 2 MG/2ML IJ SOLN
INTRAMUSCULAR | Status: AC
  Filled 2014-01-24: qty 2

## 2014-01-24 MED ORDER — BUPIVACAINE HCL (PF) 0.25 % IJ SOLN
INTRAMUSCULAR | Status: AC
  Filled 2014-01-24: qty 30

## 2014-01-24 MED ORDER — PROPOFOL 10 MG/ML IV BOLUS
INTRAVENOUS | Status: DC | PRN
  Administered 2014-01-24: 200 mg via INTRAVENOUS

## 2014-01-24 MED ORDER — ONDANSETRON HCL 4 MG/2ML IJ SOLN
INTRAMUSCULAR | Status: AC
  Filled 2014-01-24: qty 2

## 2014-01-24 MED ORDER — ARTIFICIAL TEARS OP OINT
TOPICAL_OINTMENT | OPHTHALMIC | Status: AC
  Filled 2014-01-24: qty 3.5

## 2014-01-24 MED ORDER — ONDANSETRON HCL 4 MG/2ML IJ SOLN
INTRAMUSCULAR | Status: DC | PRN
  Administered 2014-01-24: 4 mg via INTRAVENOUS

## 2014-01-24 MED ORDER — DOUBLE ANTIBIOTIC 500-10000 UNIT/GM EX OINT
TOPICAL_OINTMENT | CUTANEOUS | Status: AC
  Filled 2014-01-24: qty 1

## 2014-01-24 MED ORDER — OXYCODONE HCL 5 MG/5ML PO SOLN: 5.0000 mg | Freq: Once | ORAL | Status: AC | PRN

## 2014-01-24 MED ORDER — AMPICILLIN-SULBACTAM SODIUM 3 (2-1) G IJ SOLR
3.0000 g | Freq: Once | INTRAMUSCULAR | Status: AC
  Administered 2014-01-24: 3 g via INTRAVENOUS
  Filled 2014-01-24: qty 3

## 2014-01-24 MED ORDER — ONDANSETRON HCL 4 MG/2ML IJ SOLN
4.0000 mg | Freq: Once | INTRAMUSCULAR | Status: AC
  Administered 2014-01-24: 4 mg via INTRAVENOUS
  Filled 2014-01-24: qty 2

## 2014-01-24 MED ORDER — AMOXICILLIN-POT CLAVULANATE 875-125 MG PO TABS: 1.0000 | ORAL_TABLET | Freq: Two times a day (BID) | ORAL | Status: DC

## 2014-01-24 MED ORDER — LACTATED RINGERS IV SOLN
INTRAVENOUS | Status: DC | PRN
  Administered 2014-01-24: 20:00:00 via INTRAVENOUS

## 2014-01-24 MED ORDER — MORPHINE SULFATE 4 MG/ML IJ SOLN
4.0000 mg | Freq: Once | INTRAMUSCULAR | Status: AC
  Administered 2014-01-24: 4 mg via INTRAVENOUS
  Filled 2014-01-24: qty 1

## 2014-01-24 MED ORDER — OXYCODONE HCL 5 MG PO TABS
ORAL_TABLET | ORAL | Status: DC
Start: 2014-01-24 — End: 2014-01-25
  Filled 2014-01-24: qty 1

## 2014-01-24 MED ORDER — OXYCODONE HCL 5 MG PO TABS
5.0000 mg | ORAL_TABLET | Freq: Once | ORAL | Status: AC | PRN
  Administered 2014-01-24: 5 mg via ORAL

## 2014-01-24 MED ORDER — ONDANSETRON HCL 4 MG/2ML IJ SOLN: 4.0000 mg | Freq: Once | INTRAMUSCULAR | Status: DC | PRN

## 2014-01-24 MED ORDER — SUCCINYLCHOLINE CHLORIDE 20 MG/ML IJ SOLN
INTRAMUSCULAR | Status: AC
  Filled 2014-01-24: qty 1

## 2014-01-24 MED ORDER — 0.9 % SODIUM CHLORIDE (POUR BTL) OPTIME
TOPICAL | Status: DC | PRN
  Administered 2014-01-24: 1000 mL

## 2014-01-24 MED ORDER — OXYCODONE-ACETAMINOPHEN 5-325 MG PO TABS: ORAL_TABLET | ORAL | Status: DC

## 2014-01-24 MED ORDER — MIDAZOLAM HCL 5 MG/5ML IJ SOLN
INTRAMUSCULAR | Status: DC | PRN
  Administered 2014-01-24: 2 mg via INTRAVENOUS

## 2014-01-24 SURGICAL SUPPLY — 62 items
BANDAGE COBAN STERILE 2 (GAUZE/BANDAGES/DRESSINGS) IMPLANT
BANDAGE CONFORM 2  STR LF (GAUZE/BANDAGES/DRESSINGS) IMPLANT
BANDAGE ELASTIC 3 VELCRO ST LF (GAUZE/BANDAGES/DRESSINGS) ×2 IMPLANT
BANDAGE ELASTIC 4 VELCRO ST LF (GAUZE/BANDAGES/DRESSINGS) ×1 IMPLANT
BANDAGE GAUZE ELAST BULKY 4 IN (GAUZE/BANDAGES/DRESSINGS) ×2 IMPLANT
BNDG CMPR 9X4 STRL LF SNTH (GAUZE/BANDAGES/DRESSINGS) ×1
BNDG COHESIVE 1X5 TAN STRL LF (GAUZE/BANDAGES/DRESSINGS) IMPLANT
BNDG ESMARK 4X9 LF (GAUZE/BANDAGES/DRESSINGS) ×1 IMPLANT
CORDS BIPOLAR (ELECTRODE) ×2 IMPLANT
COVER SURGICAL LIGHT HANDLE (MISCELLANEOUS) ×2 IMPLANT
CUFF TOURNIQUET SINGLE 18IN (TOURNIQUET CUFF) ×2 IMPLANT
DECANTER SPIKE VIAL GLASS SM (MISCELLANEOUS) ×1 IMPLANT
DRAIN PENROSE 1/4X12 LTX STRL (WOUND CARE) IMPLANT
DRAPE SURG 17X23 STRL (DRAPES) ×1 IMPLANT
DRSG ADAPTIC 3X8 NADH LF (GAUZE/BANDAGES/DRESSINGS) IMPLANT
DRSG EMULSION OIL 3X3 NADH (GAUZE/BANDAGES/DRESSINGS) IMPLANT
DRSG PAD ABDOMINAL 8X10 ST (GAUZE/BANDAGES/DRESSINGS) ×2 IMPLANT
GAUZE PACKING IODOFORM 1/4X15 (GAUZE/BANDAGES/DRESSINGS) ×1 IMPLANT
GAUZE XEROFORM 1X8 LF (GAUZE/BANDAGES/DRESSINGS) ×2 IMPLANT
GLOVE BIO SURGEON STRL SZ7.5 (GLOVE) ×2 IMPLANT
GLOVE BIOGEL PI IND STRL 6.5 (GLOVE) IMPLANT
GLOVE BIOGEL PI IND STRL 7.0 (GLOVE) ×2 IMPLANT
GLOVE BIOGEL PI IND STRL 8 (GLOVE) ×1 IMPLANT
GLOVE BIOGEL PI INDICATOR 6.5 (GLOVE) ×2
GLOVE BIOGEL PI INDICATOR 7.0 (GLOVE) ×2
GLOVE BIOGEL PI INDICATOR 8 (GLOVE) ×1
GLOVE SURG SS PI 6.5 STRL IVOR (GLOVE) ×1 IMPLANT
GOWN STRL REIN XL XLG (GOWN DISPOSABLE) ×2 IMPLANT
HANDPIECE INTERPULSE COAX TIP (DISPOSABLE)
KIT BASIN OR (CUSTOM PROCEDURE TRAY) ×2 IMPLANT
KIT ROOM TURNOVER OR (KITS) ×2 IMPLANT
LOOP VESSEL MAXI BLUE (MISCELLANEOUS) IMPLANT
LOOP VESSEL MINI RED (MISCELLANEOUS) IMPLANT
MANIFOLD NEPTUNE II (INSTRUMENTS) ×2 IMPLANT
NDL HYPO 25X1 1.5 SAFETY (NEEDLE) IMPLANT
NEEDLE HYPO 25X1 1.5 SAFETY (NEEDLE) ×2 IMPLANT
NS IRRIG 1000ML POUR BTL (IV SOLUTION) ×2 IMPLANT
PACK ORTHO EXTREMITY (CUSTOM PROCEDURE TRAY) ×2 IMPLANT
PAD ARMBOARD 7.5X6 YLW CONV (MISCELLANEOUS) ×4 IMPLANT
PAD CAST 4YDX4 CTTN HI CHSV (CAST SUPPLIES) IMPLANT
PADDING CAST COTTON 4X4 STRL (CAST SUPPLIES) ×2
SCRUB BETADINE 4OZ XXX (MISCELLANEOUS) ×2 IMPLANT
SET HNDPC FAN SPRY TIP SCT (DISPOSABLE) IMPLANT
SOLUTION BETADINE 4OZ (MISCELLANEOUS) ×2 IMPLANT
SPLINT FIBERGLASS 3X12 (CAST SUPPLIES) ×1 IMPLANT
SPONGE GAUZE 4X4 12PLY (GAUZE/BANDAGES/DRESSINGS) ×2 IMPLANT
SPONGE GAUZE 4X4 12PLY STER LF (GAUZE/BANDAGES/DRESSINGS) ×1 IMPLANT
SPONGE LAP 18X18 X RAY DECT (DISPOSABLE) IMPLANT
SPONGE LAP 4X18 X RAY DECT (DISPOSABLE) ×1 IMPLANT
SUCTION FRAZIER TIP 10 FR DISP (SUCTIONS) ×2 IMPLANT
SUT ETHILON 4 0 PS 2 18 (SUTURE) ×2 IMPLANT
SUT MON AB 5-0 P3 18 (SUTURE) IMPLANT
SYR CONTROL 10ML LL (SYRINGE) ×1 IMPLANT
SYRINGE 10CC LL (SYRINGE) ×1 IMPLANT
TOWEL OR 17X24 6PK STRL BLUE (TOWEL DISPOSABLE) ×3 IMPLANT
TOWEL OR 17X26 10 PK STRL BLUE (TOWEL DISPOSABLE) ×2 IMPLANT
TUBE ANAEROBIC SPECIMEN COL (MISCELLANEOUS) ×1 IMPLANT
TUBE CONNECTING 12X1/4 (SUCTIONS) ×2 IMPLANT
TUBE FEEDING 5FR 15 INCH (TUBING) ×1 IMPLANT
UNDERPAD 30X30 INCONTINENT (UNDERPADS AND DIAPERS) ×2 IMPLANT
WATER STERILE IRR 1000ML POUR (IV SOLUTION) ×2 IMPLANT
YANKAUER SUCT BULB TIP NO VENT (SUCTIONS) ×1 IMPLANT

## 2014-01-24 NOTE — Discharge Instructions (Addendum)
[facility] Instructions Hand Surgery  Wound Care: Keep your hand elevated above the level of your heart.  Do not allow it to dangle by your side.  Keep the dressing dry and do not remove it unless your doctor advises you to do so.  He will usually change it at the time of your post-op visit.  Moving your fingers is advised to stimulate circulation but will depend on the site of your surgery.  If you have a splint applied, your doctor will advise you regarding movement.  Activity: Do not drive or operate machinery today.  Rest today and then you may return to your normal activity and work as indicated by your physician.  Diet:  Drink liquids today or eat a light diet.  You may resume a regular diet tomorrow.    General expectations: Pain for two to three days. Fingers may become slightly swollen.  Call your doctor if any of the following occur: Severe pain not relieved by pain medication. Elevated temperature. Dressing soaked with blood. Inability to move fingers. White or bluish color to fingers.   Pain Medicine Instructions    You have been given a prescription for pain medicines. These medicines may affect your ability to think clearly. They may also affect your ability to perform physical activities. Take these medicines only as needed for pain. You do not need to take them if you are not having pain, unless directed by your caregiver. You can take less than the prescribed dose if you find a smaller amount of medicine controls the pain. It may not be possible to make all of your pain go away, but you should be comfortable enough to move, breathe, and take care of yourself.  After you start taking pain medicines, while taking the medicines, and for 8 hours after stopping the medicines:  Do not drive.  Do not operate machinery.  Do not operate power tools.  Do not sign legal documents.  Do not supervise children by yourself.  Do not participate in activities that require climbing  or being in high places.  Do not enter a body of water (lake, river, ocean, spa, swimming pool) without an adult nearby who can help you. You may have been prescribed a pain medicine that contains acetaminophen (paracetamol). If so, take only the amount directed by your caregiver. Do not take any other acetaminophen while taking this medicine. An overdose of acetaminophen can result in severe liver damage. If you are taking other medicines, check the active ingredients for acetaminophen. Acetaminophen is found in hundreds of over-the-counter and prescription medicines. These include cold relief products, menstrual cramp relief medicines, fever-reducing medicines, acid indigestion relief products, and pain relief products.  HOME CARE INSTRUCTIONS  Do not drink alcohol, take sleeping pills, or take other medicines until at least 8 hours after your last dose of pain medicine, or as directed by your caregiver.  Use a bulk stool softener if you become constipated from your pain medicines. Increasing your intake of fruits and vegetables will also help.  Write down the times when you take your medicines. Look at the times before taking your next dose of medicine. It is easy to become confused while on pain medicines. Recording the times helps you to avoid an overdose. SEEK MEDICAL CARE IF:  Your medicine is not helping the pain go away.  You vomit or have diarrhea shortly after taking the medicine.  You develop new pain in areas that did not hurt before. SEEK IMMEDIATE MEDICAL  CARE IF:  You feel dizzy or faint.  You feel there are other problems that might be caused by your medicine. MAKE SURE YOU:  Understand these instructions.  Will watch your condition.  Will get help right away if you are not doing well or get worse. Document Released: 11/07/2000 Document Revised: 11/26/2012 Document Reviewed: 07/16/2010  [facility] Patient Information 2014 [place] [place].

## 2014-01-24 NOTE — OR Nursing (Signed)
1/4 inch Iodoform packing in Right thumb

## 2014-01-24 NOTE — Transfer of Care (Signed)
Immediate Anesthesia Transfer of Care Note  Patient: Ashley AlvineMelinda W Ferrucci  Procedure(s) Performed: Procedure(s): IRRIGATION AND DEBRIDEMENT RIGHT THUMB AND IP JOINT AND FLEXOR TENDON SHEATH (Right)  Patient Location: PACU  Anesthesia Type:General  Level of Consciousness: awake  Airway & Oxygen Therapy: Patient Spontanous Breathing and Patient connected to nasal cannula oxygen  Post-op Assessment: Report given to PACU RN, Post -op Vital signs reviewed and stable, Patient moving all extremities and Patient able to stick tongue midline  Post vital signs: Reviewed and stable  Complications: No apparent anesthesia complications

## 2014-01-24 NOTE — Op Note (Signed)
[id number] 

## 2014-01-24 NOTE — ED Provider Notes (Signed)
CSN: [id number]     Arrival date & time 01/24/14  1542 History   First MD Initiated Contact with Patient 01/24/14 1808     Chief Complaint  Patient presents with  . Cellulitis     (Consider location/radiation/quality/duration/timing/severity/associated sxs/prior Treatment) HPI  43 year old female who was sent here from orthopedics office for management of right thumb infection from a cat bite yesterday. Patient reports she was brushing her cat yesterday.  The cat's claw accidentally got hung on the skin of her R hand.  She attempted to remove it when the cat bitten her on her R thumb.  Report acute onset of sharp throbbing pain to right thumb and stasis thumb subsequently became swollen. Increasing pain with movement but no numbness. She was seen at urgent care and was sent to the orthopedic Dr. office and now she is here for further management. She did receive a tetanus shot here. States the cat is up-to-date with immunization including rabies and tightness. She mentioned her pain is currently minimal and does not want any medication for it. She denies any other injury.  Past Medical History  Diagnosis Date  . Hypothyroidism   . Breast cyst    Past Surgical History  Procedure Laterality Date  . Appendectomy     Family History  Problem Relation Age of Onset  . Hypertension Other   . Stroke Other   . Cancer Other     lung, cervical   . Coronary artery disease Other   . Asthma Mother   . Coronary artery disease Paternal Grandfather   . Cancer Paternal Grandfather     bladder  . Cancer Paternal Grandmother     lung  . Coronary artery disease Paternal Grandmother   . Coronary artery disease Maternal Grandmother   . Coronary artery disease Maternal Grandfather   . Hypertension Father    History  Substance Use Topics  . Smoking status: Never Smoker   . Smokeless tobacco: Never Used  . Alcohol Use: Yes     Comment: occ.   OB History   Grav Para Term Preterm Abortions TAB SAB  Ect Mult Living   3 2   1  1   2      Review of Systems  Constitutional: Negative for fever.  Skin: Positive for rash and wound.      Allergies  Sulfa antibiotics  Home Medications   Prior to Admission medications   Medication Sig Start Date End Date Taking? Authorizing Provider  azithromycin (ZITHROMAX) 250 MG tablet Take 2 today and 1 daily x 4 days 10/11/13   Adline PotterJennifer A Griffin, NP  IRON, FERROUS GLUCONATE, PO Take 1 tablet by mouth daily.    Historical Provider, MD  Magnesium Oxide (MAG-CAPS PO) Take 2 tablets by mouth daily.    Historical Provider, MD  Nutritional Supplements (JUICE PLUS FIBRE PO) Take 1 tablet by mouth.    Historical Provider, MD  Testosterone Propionate 2 % CREA Place 1 each onto the skin daily.    Historical Provider, MD  thyroid (ARMOUR THYROID) 60 MG tablet Take 60 mg by mouth daily.    Historical Provider, MD   BP 117/69  Pulse 74  Temp(Src) 98.5 F (36.9 C) (Oral)  Resp 16  Ht 5\' 3"  (1.6 m)  Wt 126 lb 1.6 oz (57.199 kg)  BMI 22.34 kg/m2  SpO2 100% Physical Exam  Nursing note and vitals reviewed. Constitutional: She appears well-developed and well-nourished. No distress.  HENT:  Head: Atraumatic.  Eyes: Conjunctivae  are normal.  Neck: Neck supple.  Musculoskeletal: She exhibits tenderness (R thumb: 2 puncture wound noted at PIP volarly and dorsaly.  Thumb is erythematous and edematous with red streaking towards base of thumb.  decreased joint movement 2/2 pain.  no abscess.  sensation intact, brisk cap refill).  Neurological: She is alert.  Skin: No rash noted.  Psychiatric: She has a normal mood and affect.    ED Course  Procedures (including critical care time)  6:29 PM R thumb injury 2/2 cat bite.  Dr. Merlyn LotKuzma has been consulted, has seen pt and will take pt to OR for surgical intervention.  Last meal at 2:30pm.  Pt aware of plan.    Labs Review Labs Reviewed  GRAM STAIN  WOUND CULTURE  ANAEROBIC CULTURE  CBC WITH DIFFERENTIAL   BASIC METABOLIC PANEL    Imaging Review Dg Finger Thumb Right  01/24/2014   CLINICAL DATA:  Cellulitis.  EXAM: RIGHT THUMB 2+V  COMPARISON:  None.  FINDINGS: No acute bony or joint abnormality identified. No evidence of fracture or dislocation. No focal bony lesion noted to suggest osteomyelitis.  IMPRESSION: No acute or focal bony abnormality.   Electronically Signed   By: Maisie Fushomas  Register   On: 01/24/2014 19:40     EKG Interpretation None      MDM   Final diagnoses:  Cat bite of right thumb with infection    BP 102/63  Pulse 87  Temp(Src) 98.5 F (36.9 C) (Oral)  Resp 12  Ht 5\' 3"  (1.6 m)  Wt 126 lb 1.6 oz (57.199 kg)  BMI 22.34 kg/m2  SpO2 100%  LMP 01/13/2014  I have reviewed nursing notes and vital signs. I personally reviewed the imaging tests through PACS system  I reviewed available ER/hospitalization records thought the EMR     Fayrene HelperBowie Tyra Michelle, [place] JerseyPA-C 01/25/14 0024

## 2014-01-24 NOTE — Anesthesia Procedure Notes (Signed)
Procedure Name: LMA Insertion Date/Time: 01/24/2014 8:34 PM Performed by: Ashley SquiresHASE, Ashley Jarvis Pre-anesthesia Checklist: Patient identified, Emergency Drugs available, Suction available and Patient being monitored Patient Re-evaluated:Patient Re-evaluated prior to inductionOxygen Delivery Method: Circle system utilized Preoxygenation: Pre-oxygenation with 100% oxygen Intubation Type: IV induction LMA: LMA inserted LMA Size: 4.0 Number of attempts: 1 Placement Confirmation: positive ETCO2 and breath sounds checked- equal and bilateral Tube secured with: Tape Dental Injury: Teeth and Oropharynx as per pre-operative assessment

## 2014-01-24 NOTE — H&P (Signed)
Ashley Jarvis is an 43 y.o. female.   Chief Complaint: right thumb infection HPI: 43 yo female states she was bitten by her cat yesterday.  Seen at [facility] and started on Augmentin yesterday.  Increased pain, swelling, erythema today.  Seen by Dr. Hal Morales and referred for care.  Reports no previous injury to right thumb and no other injury at this time.  No fevers or night sweats.  Recent chill.  Past Medical History  Diagnosis Date  . Hypothyroidism   . Breast cyst     Past Surgical History  Procedure Laterality Date  . Appendectomy      Family History  Problem Relation Age of Onset  . Hypertension Other   . Stroke Other   . Cancer Other     lung, cervical   . Coronary artery disease Other   . Asthma Mother   . Coronary artery disease Paternal Grandfather   . Cancer Paternal Grandfather     bladder  . Cancer Paternal Grandmother     lung  . Coronary artery disease Paternal Grandmother   . Coronary artery disease Maternal Grandmother   . Coronary artery disease Maternal Grandfather   . Hypertension Father    Social History:  reports that she has never smoked. She has never used smokeless tobacco. She reports that she drinks alcohol. She reports that she does not use illicit drugs.  Allergies:  Allergies  Allergen Reactions  . Sulfa Antibiotics      (Not in a hospital admission)  Results for orders placed during the hospital encounter of 01/24/14 (from the past 48 hour(s))  CBC WITH DIFFERENTIAL     Status: None   Collection Time    01/24/14  6:36 PM      Result Value Ref Range   WBC 7.9  4.0 - 10.5 K/uL   RBC 4.31  3.87 - 5.11 MIL/uL   Hemoglobin 12.5  12.0 - 15.0 g/dL   HCT 38.1  36.0 - 46.0 %   MCV 88.4  78.0 - 100.0 fL   MCH 29.0  26.0 - 34.0 pg   MCHC 32.8  30.0 - 36.0 g/dL   RDW 13.5  11.5 - 15.5 %   Platelets 237  150 - 400 K/uL   Neutrophils Relative % 61  43 - 77 %   Neutro Abs 4.8  1.7 - 7.7 K/uL   Lymphocytes Relative 30  12 - 46 %   Lymphs Abs 2.3   0.7 - 4.0 K/uL   Monocytes Relative 7  3 - 12 %   Monocytes Absolute 0.6  0.1 - 1.0 K/uL   Eosinophils Relative 2  0 - 5 %   Eosinophils Absolute 0.2  0.0 - 0.7 K/uL   Basophils Relative 0  0 - 1 %   Basophils Absolute 0.0  0.0 - 0.1 K/uL  BASIC METABOLIC PANEL     Status: None   Collection Time    01/24/14  6:36 PM      Result Value Ref Range   Sodium 143  137 - 147 mEq/L   Potassium 4.1  3.7 - 5.3 mEq/L   Chloride 104  96 - 112 mEq/L   CO2 22  19 - 32 mEq/L   Glucose, Bld 87  70 - 99 mg/dL   BUN 11  6 - 23 mg/dL   Creatinine, Ser 0.56  0.50 - 1.10 mg/dL   Calcium 9.3  8.4 - 10.5 mg/dL   GFR calc non Af Amer >  90  >90 mL/min   GFR calc Af Amer >90  >90 mL/min   Comment: (NOTE)     The eGFR has been calculated using the CKD EPI equation.     This calculation has not been validated in all clinical situations.     eGFR's persistently <90 mL/min signify possible Chronic Kidney     Disease.    Dg Finger Thumb Right  01/24/2014   CLINICAL DATA:  Cellulitis.  EXAM: RIGHT THUMB 2+V  COMPARISON:  None.  FINDINGS: No acute bony or joint abnormality identified. No evidence of fracture or dislocation. No focal bony lesion noted to suggest osteomyelitis.  IMPRESSION: No acute or focal bony abnormality.   Electronically Signed   By: Marcello Moores  Register   On: 01/24/2014 19:40     A comprehensive review of systems was negative except for: Constitutional: positive for chills  Blood pressure 103/68, pulse 69, temperature 98.5 F (36.9 C), temperature source Oral, resp. rate 18, height 5' 3"  (1.6 m), weight 57.199 kg (126 lb 1.6 oz), last menstrual period 01/13/2014, SpO2 99.00%.  General appearance: alert, cooperative and appears stated age Head: Normocephalic, without obvious abnormality, atraumatic Neck: supple, symmetrical, trachea midline Resp: clear to auscultation bilaterally Cardio: regular rate and rhythm GI: non tender Extremities: intact sensation and capillary refill all digits.   +epl/fpl/io.  pain with motion right thumb.  bite wound volary and dorsally at ip joint right thumb.  swelling and erythema at bite sites.  some tenderness over proximal phalanx volarly.  no proximal streaking. Pulses: 2+ and symmetric Skin: Skin color, texture, turgor normal. No rashes or lesions Neurologic: Grossly normal Incision/Wound: As above  Assessment/Plan Right thumb infected cat bite likely involving ip joint and possibly tendon sheath.  Recommend OR for I&D of right thumb including ip joint and possibly tendon sheath.  Risks, benefits, and alternatives of surgery were discussed and the patient agrees with the plan of care.   Corrisa Gibby R 01/24/2014, 8:17 PM

## 2014-01-24 NOTE — ED Notes (Signed)
Dr Merlyn Lotkuzma states for EDP to evaluate pt and he will come see the pt after he completes his OR cases

## 2014-01-24 NOTE — Brief Op Note (Signed)
01/24/2014  9:21 PM  PATIENT:  Ashley Jarvis  43 y.o. female  PRE-OPERATIVE DIAGNOSIS:  Cat Bite Right thumb  POST-OPERATIVE DIAGNOSIS:  Cat bite right thumb   PROCEDURE:  Procedure(s): IRRIGATION AND DEBRIDEMENT RIGHT THUMB AND IP JOINT AND FLEXOR TENDON SHEATH (Right)  SURGEON:  Surgeon(s) and Role:    * Tami RibasKevin R Johathan Province, MD - Primary  PHYSICIAN ASSISTANT:   ASSISTANTS: none   ANESTHESIA:   general  EBL:     BLOOD ADMINISTERED:none  DRAINS: iodoform packing and vessel loop drains  LOCAL MEDICATIONS USED:  MARCAINE     SPECIMEN:  Source of Specimen:  right thumb  DISPOSITION OF SPECIMEN:  micro  COUNTS:  YES  TOURNIQUET:   Total Tourniquet Time Documented: Upper Arm (Right) - 33 minutes Total: Upper Arm (Right) - 33 minutes   DICTATION: .Other Dictation: Dictation Number [id number]  PLAN OF CARE: Discharge to home after PACU  PATIENT DISPOSITION:  PACU - hemodynamically stable.

## 2014-01-24 NOTE — Anesthesia Preprocedure Evaluation (Signed)
Anesthesia Evaluation  Patient identified by MRN, date of birth, ID band Patient awake    Reviewed: Allergy & Precautions, H&P , NPO status , Patient's Chart, lab work & pertinent test results  Airway Mallampati: I TM Distance: >3 FB Neck ROM: Full    Dental  (+) Teeth Intact, Dental Advisory Given   Pulmonary  breath sounds clear to auscultation        Cardiovascular Rhythm:Regular Rate:Normal     Neuro/Psych    GI/Hepatic   Endo/Other    Renal/GU      Musculoskeletal   Abdominal   Peds  Hematology   Anesthesia Other Findings Braces  Reproductive/Obstetrics                           Anesthesia Physical Anesthesia Plan  ASA: II  Anesthesia Plan: General   Post-op Pain Management:    Induction: Intravenous  Airway Management Planned: Oral ETT  Additional Equipment:   Intra-op Plan:   Post-operative Plan: Extubation in OR  Informed Consent: I have reviewed the patients History and Physical, chart, labs and discussed the procedure including the risks, benefits and alternatives for the proposed anesthesia with the patient or authorized representative who has indicated his/her understanding and acceptance.   Dental advisory given  Plan Discussed with: CRNA, Anesthesiologist and Surgeon  Anesthesia Plan Comments:         Anesthesia Quick Evaluation

## 2014-01-24 NOTE — ED Notes (Signed)
Pt brought back to room with family in tow; pt getting undressed and into a gown at this time; Trula OreChristina, RN aware

## 2014-01-24 NOTE — Anesthesia Postprocedure Evaluation (Signed)
  Anesthesia Post-op Note  Patient: Ashley AlvineMelinda W Jarvis  Procedure(s) Performed: Procedure(s): IRRIGATION AND DEBRIDEMENT RIGHT THUMB AND IP JOINT AND FLEXOR TENDON SHEATH (Right)  Patient Location: PACU  Anesthesia Type:General  Level of Consciousness: awake, alert  and oriented  Airway and Oxygen Therapy: Patient Spontanous Breathing and Patient connected to nasal cannula oxygen  Post-op Pain: none  Post-op Assessment: Post-op Vital signs reviewed  Post-op Vital Signs: Reviewed  Last Vitals:  Filed Vitals:   01/24/14 2145  BP: 112/70  Pulse: 74  Temp:   Resp: 9    Complications: No apparent anesthesia complications

## 2014-01-24 NOTE — ED Notes (Signed)
Pt sent from [facility] to see dr Merlyn Lotkuzma for R thumb infection after a cat bite yesterday

## 2014-01-25 NOTE — Op Note (Signed)
NAMSeveriano Gilbert:  Toure, Edelin               ACCOUNT NO.:  [id number]  MEDICAL RECORD NO.:  [id number]  LOCATION:  [facility]                         FACILITY:  [facility]  PHYSICIAN:  Betha LoaKevin Aireana Ryland, MD        DATE OF BIRTH:  [birth year]  DATE OF PROCEDURE:  01/24/2014 DATE OF DISCHARGE:  01/24/2014                              OPERATIVE REPORT   PREOPERATIVE DIAGNOSIS:  Right thumb IP joint and possibly flexor sheath infection.  POSTOPERATIVE DIAGNOSIS:  Right thumb IP joint and flexor sheath infection.  PROCEDURE:  Right thumb incision and drainage of the IP joint and flexor tendon sheath.  SURGEON:  Betha LoaKevin Jaleeya Mcnelly, MD  ASSISTANTS:  None.  ANESTHESIA:  General.  IV FLUIDS:  Per anesthesia flow sheet.  ESTIMATED BLOOD LOSS:  Minimal.  COMPLICATIONS:  None.  SPECIMENS:  Cultures from right thumb to micro.  TOURNIQUET TIME:  33 minutes.  DISPOSITION:  Stable to PACU.  INDICATIONS:  Ms. Logan Boresvans is a 43 year old left-hand dominant female who states she was bitten by her cat yesterday.  She was seen at Urgent Care yesterday and started on Augmentin.  She noticed increased pain, swelling, and erythema today.  She was seen by Dr. Cleophas DunkerBassett who referred her to me for further care.  On evaluation, she had intact sensation and capillary refill in the fingertip.  She had a bite wound on the dorsum of the thumb at the IP joint with surrounding swelling, erythema and tenderness to palpation.  There was a bite wound volarly at the level of the IP joint.  This was also tender.  She was tender over the volar aspect of the proximal phalanx.  There was no proximal streaking.  I recommended to Ms. Pals going to the operating room for irrigation and debridement of the IP joint possibly the flexor tendon sheath.  Risks, benefits, and alternatives of surgery were discussed including risk of blood loss, infection, damage to nerves, vessels, tendons, ligaments, bone, failure of surgery, need for additional surgery,  complications with wound healing, continued pain, continued infection, need for repeat irrigation and debridement.  She voiced understanding of these risks and elected to proceed.  OPERATIVE COURSE:  After being identified preoperatively by myself, the patient and I agreed upon procedure and site of procedure.  Surgical site was marked.  The risks, benefits, and alternatives of surgery were reviewed and she wished to proceed.  Surgical consent had been signed. She was transferred to the operating room and placed on the operating room table in supine position with the right upper extremity on arm board.  General anesthesia was induced by Anesthesiology.  Right upper extremity was prepped and draped in normal sterile orthopedic fashion. Surgical pause was performed between surgeons, anesthesia, and operating room staff, and all were in agreement as to the patient, procedure, and site of procedure.  Tourniquet at the proximal aspect of the extremity was inflated to 250 mmHg after gravity exsanguination of the hand and Esmarch exsanguination of the forearm.  The wound on the dorsum of the thumb was extended in a hockey-stick fashion.  There was a thin, watery fluid within it.  Cultures were taken for aerobes, anaerobes, and Gram stain.  The joint was entered underneath the extensor tendon.  There was no gross purulence within the joint but there was synovitis and fluid. The wound and IP joint were copiously irrigated with sterile saline by Angiocath.  Attention was turned to the volar wound.  This was extended over the distal phalanx.  The soft tissues entered by spreading technique.  The flexor sheath was identified at its distal aspect.  The thumb was milked.  There was not significant fluid within the flexor sheath.  A #5 pediatric feeding tube was placed down the sheath and the sheath was copiously irrigated with sterile saline.  An incision was made at the volar aspect of the MP joint  of the thumb and carried into subcutaneous tissues by spreading technique.  Care was taken to protect the radial and ulnar digital nerves which were identified and protected. Bloody fluid was obtained from both the proximal and distal wounds during irrigation of the flexor sheath.  The wounds were all packed open with quarter-inch Iodoform gauze and a vessel loop.  Drain was placed into the IP joint to the dorsal wound.  A digital block was performed with 10 mL of 0.25% plain Marcaine to aid in postoperative analgesia. The wounds were then dressed with sterile 4x4s and wrapped with a Kerlix bandage.  A thumb spica splint was placed and wrapped with Kerlix and Ace bandage.  Tourniquet was deflated at 33 minutes.  Fingertips were pink with brisk capillary refill after deflation of the tourniquet.  The operative drapes were broken down.  The patient was awoken from anesthesia safely.  She was transferred back to stretcher and taken to PACU in stable condition.  I will see her in the office in 3 days for postoperative followup.  We will give her Percocet 5/325, 1-2 p.o. q.6 hours p.r.n. pain, dispensed #40, and she will continue on Augmentin 875 mg p.o. b.i.d.     Betha LoaKevin Armanii Urbanik, MD     KK/MEDQ  D:  01/24/2014  T:  01/25/2014  Job:  [id number]

## 2014-01-25 NOTE — ED Provider Notes (Signed)
Medical screening examination/treatment/procedure(s) were performed by non-physician practitioner and as supervising physician I was immediately available for consultation/collaboration.   EKG Interpretation None        Dagmar HaitWilliam Javae Braaten, MD 01/25/14 [phone]

## 2014-01-27 ENCOUNTER — Encounter (HOSPITAL_COMMUNITY): Payer: Self-pay | Admitting: Orthopedic Surgery

## 2014-01-27 LAB — WOUND CULTURE: Culture: NO GROWTH

## 2014-01-28 ENCOUNTER — Other Ambulatory Visit: Payer: BC Managed Care – PPO | Admitting: Adult Health

## 2014-01-29 LAB — ANAEROBIC CULTURE

## 2014-02-12 ENCOUNTER — Other Ambulatory Visit: Payer: BC Managed Care – PPO | Admitting: Adult Health

## 2014-02-26 ENCOUNTER — Encounter: Payer: Self-pay | Admitting: Adult Health

## 2014-02-26 ENCOUNTER — Other Ambulatory Visit (HOSPITAL_COMMUNITY)
Admission: RE | Admit: 2014-02-26 | Discharge: 2014-02-26 | Disposition: A | Discharge status: Home / Self Care | Payer: BC Managed Care – PPO | Source: Ambulatory Visit | Attending: Adult Health | Admitting: Adult Health

## 2014-02-26 ENCOUNTER — Ambulatory Visit (INDEPENDENT_AMBULATORY_CARE_PROVIDER_SITE_OTHER): Payer: BC Managed Care – PPO | Admitting: Adult Health

## 2014-02-26 VITALS — BP 100/72 | HR 78 | Ht 62.75 in | Wt 128.0 lb

## 2014-02-26 DIAGNOSIS — Z1151 Encounter for screening for human papillomavirus (HPV): Secondary | ICD-10-CM | POA: Insufficient documentation

## 2014-02-26 DIAGNOSIS — Z1212 Encounter for screening for malignant neoplasm of rectum: Secondary | ICD-10-CM

## 2014-02-26 DIAGNOSIS — Z124 Encounter for screening for malignant neoplasm of cervix: Secondary | ICD-10-CM | POA: Insufficient documentation

## 2014-02-26 DIAGNOSIS — Z01419 Encounter for gynecological examination (general) (routine) without abnormal findings: Secondary | ICD-10-CM

## 2014-02-26 DIAGNOSIS — Z8742 Personal history of other diseases of the female genital tract: Secondary | ICD-10-CM | POA: Insufficient documentation

## 2014-02-26 HISTORY — DX: Personal history of other diseases of the female genital tract: Z87.42

## 2014-02-26 LAB — HEMOCCULT GUIAC POC 1CARD (OFFICE): Fecal Occult Blood, POC: NEGATIVE

## 2014-02-26 NOTE — Patient Instructions (Signed)
Physical in 1 year Mammogram yearly  

## 2014-02-26 NOTE — Progress Notes (Signed)
Patient ID: Ashley Jarvis, female   DOB: [birth year], 43 y.o.   MRN: [id number] History of Present Illness: Ashley Jarvis is a 43 year old white female, married in for a pap and physical.She had an abnormal pap 2014 ASCUS +HCV and had colpo.She has had surgery on her right thumb in June was bitten by her cat in joint.   Current Medications, Allergies, Past Medical History, Past Surgical History, Family History and Social History were reviewed in [facility] electronic [facility] record.     Review of Systems: Patient denies any headaches, blurred vision, shortness of breath, chest pain, abdominal pain, problems with bowel movements, urination, or intercourse. No mood swings, thumb still healing so has discomfort and stiffness.    Physical Exam:BP 100/72  Pulse 78  Ht 5' 2.75" (1.594 m)  Wt 128 lb (58.06 kg)  BMI 22.85 kg/m2  LMP 02/13/2014 General:  Well developed, well nourished, no acute distress Skin:  Warm and dry Neck:  Midline trachea, normal thyroid Lungs; Clear to auscultation bilaterally Breast:  No dominant palpable mass, retraction, or nipple discharge Cardiovascular: Regular rate and rhythm Abdomen:  Soft, non tender, no hepatosplenomegaly Pelvic:  External genitalia is normal in appearance.  The vagina is normal in appearance.The cervix is bulbous, friable with brush, pap performed with HPV.  Uterus is felt to be normal size, shape, and contour.  No   adnexal masses or tenderness noted. Rectal: Good sphincter tone, no polyps, or hemorrhoids felt.  Hemoccult negative. Extremities:  No swelling or varicosities noted, except right thumb still swollen Psych:  No mood changes, alert and cooperative,seems happy   Impression: Yearly gyn exam History of abnormal pap    Plan: Physical in 1 year Mammogram yearly

## 2014-02-27 LAB — CYTOLOGY - PAP

## 2014-06-16 ENCOUNTER — Encounter: Payer: Self-pay | Admitting: Adult Health

## 2014-10-24 ENCOUNTER — Other Ambulatory Visit: Payer: Self-pay

## 2014-10-24 DIAGNOSIS — Z1231 Encounter for screening mammogram for malignant neoplasm of breast: Secondary | ICD-10-CM

## 2014-11-10 ENCOUNTER — Ambulatory Visit
Admission: RE | Admit: 2014-11-10 | Discharge: 2014-11-10 | Disposition: A | Discharge status: Home / Self Care | Payer: BLUE CROSS/BLUE SHIELD | Source: Ambulatory Visit

## 2014-11-10 DIAGNOSIS — Z1231 Encounter for screening mammogram for malignant neoplasm of breast: Secondary | ICD-10-CM

## 2014-11-13 ENCOUNTER — Ambulatory Visit: Payer: Self-pay

## 2015-03-05 ENCOUNTER — Encounter: Payer: Self-pay | Admitting: Adult Health

## 2015-03-05 ENCOUNTER — Other Ambulatory Visit (HOSPITAL_COMMUNITY)
Admission: RE | Admit: 2015-03-05 | Discharge: 2015-03-05 | Disposition: A | Discharge status: Home / Self Care | Payer: BLUE CROSS/BLUE SHIELD | Source: Ambulatory Visit | Attending: Obstetrics and Gynecology | Admitting: Obstetrics and Gynecology

## 2015-03-05 ENCOUNTER — Ambulatory Visit (INDEPENDENT_AMBULATORY_CARE_PROVIDER_SITE_OTHER): Payer: BLUE CROSS/BLUE SHIELD | Admitting: Adult Health

## 2015-03-05 VITALS — BP 106/70 | HR 64 | Ht 63.5 in | Wt 132.0 lb

## 2015-03-05 DIAGNOSIS — Z1212 Encounter for screening for malignant neoplasm of rectum: Secondary | ICD-10-CM | POA: Diagnosis not present

## 2015-03-05 DIAGNOSIS — Z01419 Encounter for gynecological examination (general) (routine) without abnormal findings: Secondary | ICD-10-CM | POA: Diagnosis present

## 2015-03-05 LAB — HEMOCCULT GUIAC POC 1CARD (OFFICE): Fecal Occult Blood, POC: NEGATIVE

## 2015-03-05 NOTE — Patient Instructions (Signed)
Physical in 1 year Mammogram yearly Colonoscopy at 48  Labs with PCP 

## 2015-03-05 NOTE — Progress Notes (Signed)
Patient ID: Ashley Jarvis, female   DOB: [birth year], 44 y.o.   MRN: [id number] History of Present Illness: Hermenia is a 44 year old white female, married in for well woman gyn exam , last pap was normal with negative HPV 02/26/14 but she requests pap this year too.   Current Medications, Allergies, Past Medical History, Past Surgical History, Family History and Social History were reviewed in [employer] record.     Review of Systems: Patient denies any headaches, hearing loss, fatigue, blurred vision, shortness of breath, chest pain, abdominal pain, problems with bowel movements, urination, or intercourse. No joint pain or mood swings.Has noticed some breast tenderness and bloating 2 weeks before period.Periods regular.    Physical Exam:BP 106/70 mmHg  Pulse 64  Ht 5' 3.5" (1.613 m)  Wt 132 lb (59.875 kg)  BMI 23.01 kg/m2  LMP 02/06/2015 General:  Well developed, well nourished, no acute distress Skin:  Warm and dry Neck:  Midline trachea, normal thyroid, good ROM, no lymphadenopathy Lungs; Clear to auscultation bilaterally Breast:  No dominant palpable mass, retraction, or nipple discharge, has fibroglandular tissue Cardiovascular: Regular rate and rhythm Abdomen:  Soft, non tender, no hepatosplenomegaly Pelvic:  External genitalia is normal in appearance, no lesions.  The vagina is normal in appearance. Urethra has no lesions or masses. The cervix is bulbous.Pap performed at her request.  Uterus is felt to be normal size, shape, and contour.  No adnexal masses or tenderness noted.Bladder is non tender, no masses felt. Rectal: Good sphincter tone, no polyps, or hemorrhoids felt.  Hemoccult negative.Mild rectocele Extremities/musculoskeletal:  No swelling or varicosities noted, no clubbing or cyanosis Psych:  No mood changes, alert and cooperative,seems happy   Impression: Well woman gyn exam with pap    Plan: Physical in 1 year Mammogram  yearly Colonoscopy at 48 Labs with PCP

## 2015-03-06 LAB — CYTOLOGY - PAP

## 2015-12-16 ENCOUNTER — Other Ambulatory Visit: Payer: Self-pay

## 2015-12-16 DIAGNOSIS — Z1231 Encounter for screening mammogram for malignant neoplasm of breast: Secondary | ICD-10-CM

## 2016-01-26 ENCOUNTER — Ambulatory Visit: Payer: BLUE CROSS/BLUE SHIELD

## 2016-02-10 DIAGNOSIS — E039 Hypothyroidism, unspecified: Secondary | ICD-10-CM | POA: Diagnosis not present

## 2016-02-10 DIAGNOSIS — E559 Vitamin D deficiency, unspecified: Secondary | ICD-10-CM | POA: Diagnosis not present

## 2016-02-10 DIAGNOSIS — R5381 Other malaise: Secondary | ICD-10-CM | POA: Diagnosis not present

## 2016-02-10 DIAGNOSIS — N943 Premenstrual tension syndrome: Secondary | ICD-10-CM | POA: Diagnosis not present

## 2016-02-11 ENCOUNTER — Ambulatory Visit: Payer: BLUE CROSS/BLUE SHIELD

## 2016-02-23 ENCOUNTER — Ambulatory Visit: Payer: BLUE CROSS/BLUE SHIELD

## 2016-02-23 ENCOUNTER — Other Ambulatory Visit: Payer: Self-pay | Admitting: Adult Health

## 2016-02-23 ENCOUNTER — Ambulatory Visit
Admission: RE | Admit: 2016-02-23 | Discharge: 2016-02-23 | Disposition: A | Discharge status: Home / Self Care | Payer: BLUE CROSS/BLUE SHIELD | Source: Ambulatory Visit

## 2016-02-23 DIAGNOSIS — Z1231 Encounter for screening mammogram for malignant neoplasm of breast: Secondary | ICD-10-CM

## 2016-04-14 DIAGNOSIS — R51 Headache: Secondary | ICD-10-CM | POA: Diagnosis not present

## 2016-04-14 DIAGNOSIS — R768 Other specified abnormal immunological findings in serum: Secondary | ICD-10-CM | POA: Diagnosis not present

## 2016-04-14 DIAGNOSIS — R5383 Other fatigue: Secondary | ICD-10-CM | POA: Diagnosis not present

## 2016-04-14 DIAGNOSIS — R5381 Other malaise: Secondary | ICD-10-CM | POA: Diagnosis not present

## 2016-04-14 DIAGNOSIS — E039 Hypothyroidism, unspecified: Secondary | ICD-10-CM | POA: Diagnosis not present

## 2016-04-28 DIAGNOSIS — Z86018 Personal history of other benign neoplasm: Secondary | ICD-10-CM | POA: Diagnosis not present

## 2016-04-28 DIAGNOSIS — D225 Melanocytic nevi of trunk: Secondary | ICD-10-CM | POA: Diagnosis not present

## 2016-04-28 DIAGNOSIS — D485 Neoplasm of uncertain behavior of skin: Secondary | ICD-10-CM | POA: Diagnosis not present

## 2016-04-28 DIAGNOSIS — L821 Other seborrheic keratosis: Secondary | ICD-10-CM | POA: Diagnosis not present

## 2016-05-11 DIAGNOSIS — R5383 Other fatigue: Secondary | ICD-10-CM | POA: Diagnosis not present

## 2016-05-11 DIAGNOSIS — R5381 Other malaise: Secondary | ICD-10-CM | POA: Diagnosis not present

## 2016-05-11 DIAGNOSIS — R51 Headache: Secondary | ICD-10-CM | POA: Diagnosis not present

## 2016-05-11 DIAGNOSIS — R768 Other specified abnormal immunological findings in serum: Secondary | ICD-10-CM | POA: Diagnosis not present

## 2016-07-11 DIAGNOSIS — H524 Presbyopia: Secondary | ICD-10-CM | POA: Diagnosis not present

## 2016-07-22 DIAGNOSIS — E559 Vitamin D deficiency, unspecified: Secondary | ICD-10-CM | POA: Diagnosis not present

## 2016-07-22 DIAGNOSIS — A6920 Lyme disease, unspecified: Secondary | ICD-10-CM | POA: Diagnosis not present

## 2016-07-22 DIAGNOSIS — R768 Other specified abnormal immunological findings in serum: Secondary | ICD-10-CM | POA: Diagnosis not present

## 2016-07-22 DIAGNOSIS — R5383 Other fatigue: Secondary | ICD-10-CM | POA: Diagnosis not present

## 2016-10-20 DIAGNOSIS — A6920 Lyme disease, unspecified: Secondary | ICD-10-CM | POA: Diagnosis not present

## 2016-10-20 DIAGNOSIS — R51 Headache: Secondary | ICD-10-CM | POA: Diagnosis not present

## 2016-10-20 DIAGNOSIS — R768 Other specified abnormal immunological findings in serum: Secondary | ICD-10-CM | POA: Diagnosis not present

## 2016-10-20 DIAGNOSIS — R5381 Other malaise: Secondary | ICD-10-CM | POA: Diagnosis not present

## 2016-10-20 DIAGNOSIS — E559 Vitamin D deficiency, unspecified: Secondary | ICD-10-CM | POA: Diagnosis not present

## 2016-12-14 DIAGNOSIS — M25571 Pain in right ankle and joints of right foot: Secondary | ICD-10-CM | POA: Diagnosis not present

## 2017-01-23 ENCOUNTER — Other Ambulatory Visit: Payer: Self-pay | Admitting: Adult Health

## 2017-01-23 DIAGNOSIS — Z1231 Encounter for screening mammogram for malignant neoplasm of breast: Secondary | ICD-10-CM

## 2017-02-09 DIAGNOSIS — E559 Vitamin D deficiency, unspecified: Secondary | ICD-10-CM | POA: Diagnosis not present

## 2017-02-09 DIAGNOSIS — R768 Other specified abnormal immunological findings in serum: Secondary | ICD-10-CM | POA: Diagnosis not present

## 2017-02-09 DIAGNOSIS — R51 Headache: Secondary | ICD-10-CM | POA: Diagnosis not present

## 2017-02-09 DIAGNOSIS — B2790 Infectious mononucleosis, unspecified without complication: Secondary | ICD-10-CM | POA: Diagnosis not present

## 2017-02-28 ENCOUNTER — Ambulatory Visit
Admission: RE | Admit: 2017-02-28 | Discharge: 2017-02-28 | Disposition: A | Discharge status: Home / Self Care | Payer: BLUE CROSS/BLUE SHIELD | Source: Ambulatory Visit | Attending: Adult Health | Admitting: Adult Health

## 2017-02-28 DIAGNOSIS — R5383 Other fatigue: Secondary | ICD-10-CM | POA: Diagnosis not present

## 2017-02-28 DIAGNOSIS — Z1231 Encounter for screening mammogram for malignant neoplasm of breast: Secondary | ICD-10-CM

## 2017-02-28 DIAGNOSIS — R5381 Other malaise: Secondary | ICD-10-CM | POA: Diagnosis not present

## 2017-02-28 DIAGNOSIS — B2790 Infectious mononucleosis, unspecified without complication: Secondary | ICD-10-CM | POA: Diagnosis not present

## 2017-02-28 DIAGNOSIS — R51 Headache: Secondary | ICD-10-CM | POA: Diagnosis not present

## 2017-03-01 ENCOUNTER — Other Ambulatory Visit: Payer: Self-pay | Admitting: Adult Health

## 2017-03-01 DIAGNOSIS — R928 Other abnormal and inconclusive findings on diagnostic imaging of breast: Secondary | ICD-10-CM

## 2017-03-07 ENCOUNTER — Ambulatory Visit: Admission: RE | Admit: 2017-03-07 | Payer: BLUE CROSS/BLUE SHIELD | Source: Ambulatory Visit

## 2017-03-07 ENCOUNTER — Ambulatory Visit
Admission: RE | Admit: 2017-03-07 | Discharge: 2017-03-07 | Disposition: A | Discharge status: Home / Self Care | Payer: BLUE CROSS/BLUE SHIELD | Source: Ambulatory Visit | Attending: Adult Health | Admitting: Adult Health

## 2017-03-07 DIAGNOSIS — R922 Inconclusive mammogram: Secondary | ICD-10-CM | POA: Diagnosis not present

## 2017-03-07 DIAGNOSIS — R928 Other abnormal and inconclusive findings on diagnostic imaging of breast: Secondary | ICD-10-CM

## 2017-03-30 DIAGNOSIS — H1131 Conjunctival hemorrhage, right eye: Secondary | ICD-10-CM | POA: Diagnosis not present

## 2017-04-27 DIAGNOSIS — L821 Other seborrheic keratosis: Secondary | ICD-10-CM | POA: Diagnosis not present

## 2017-04-27 DIAGNOSIS — D2262 Melanocytic nevi of left upper limb, including shoulder: Secondary | ICD-10-CM | POA: Diagnosis not present

## 2017-04-27 DIAGNOSIS — Z86018 Personal history of other benign neoplasm: Secondary | ICD-10-CM | POA: Diagnosis not present

## 2017-04-27 DIAGNOSIS — D225 Melanocytic nevi of trunk: Secondary | ICD-10-CM | POA: Diagnosis not present

## 2017-05-08 ENCOUNTER — Ambulatory Visit: Payer: BLUE CROSS/BLUE SHIELD | Admitting: Adult Health

## 2017-05-16 ENCOUNTER — Ambulatory Visit (INDEPENDENT_AMBULATORY_CARE_PROVIDER_SITE_OTHER): Payer: BLUE CROSS/BLUE SHIELD | Admitting: Adult Health

## 2017-05-16 ENCOUNTER — Encounter: Payer: Self-pay | Admitting: Adult Health

## 2017-05-16 VITALS — BP 100/80 | HR 76 | Ht 63.2 in | Wt 140.4 lb

## 2017-05-16 DIAGNOSIS — R140 Abdominal distension (gaseous): Secondary | ICD-10-CM | POA: Insufficient documentation

## 2017-05-16 DIAGNOSIS — R102 Pelvic and perineal pain: Secondary | ICD-10-CM | POA: Diagnosis not present

## 2017-05-16 NOTE — Patient Instructions (Signed)
1 day for [place] Pap and physical in 1 week

## 2017-05-16 NOTE — Progress Notes (Signed)
Subjective:     Patient ID: Ashley Jarvis, female   DOB: 02-24-1971, 46 y.o.   MRN: [id number]  HPI Ashley Jarvis is a 46 year old white female, married, in complaining of low pelvic pain and bloating.Had period last week.   Review of Systems Low pelvic pain +bloating BMs 23 x daily  no problems peeing No pain with sex Reviewed past medical,surgical, social and family history. Reviewed medications and allergies.     Objective:   Physical Exam BP 100/80 (BP Location: Left Arm, Patient Position: Sitting, Cuff Size: Small)   Pulse 76   Ht 5' 3.2" (1.605 m)   Wt 140 lb 6.4 oz (63.7 kg)   LMP 05/10/2017 (Approximate)   BMI 24.71 kg/m PHQ 2 score 0. Skin warm and dry.Pelvic: external genitalia is normal in appearance no lesions, vagina:pink with good moisture and rugae,urethra has no lesions or masses noted, cervix:everted at os and  bulbous, uterus: normal size, shape and contour, non tender, no masses felt, adnexa: no masses or tenderness noted. Bladder is non tender and no masses felt.    Will get [place] to assess ovaries and uterus, but told her could also be bowel related.  Assessment:     1. Pelvic pain   2. Bloating       Plan:     Return in 1 day for GYN [place] Return in 1 week for pap and physical with me

## 2017-05-17 ENCOUNTER — Ambulatory Visit (INDEPENDENT_AMBULATORY_CARE_PROVIDER_SITE_OTHER): Payer: BLUE CROSS/BLUE SHIELD

## 2017-05-17 DIAGNOSIS — R102 Pelvic and perineal pain: Secondary | ICD-10-CM

## 2017-05-17 DIAGNOSIS — R140 Abdominal distension (gaseous): Secondary | ICD-10-CM | POA: Diagnosis not present

## 2017-05-17 NOTE — Progress Notes (Signed)
PELVIC US TA/TV:homogeneous anteverted uterus,wnl,normal ovaries bilat,ovaries appear mobile,EEC 7.3 mm,no free fluid,no pain during ultrasound

## 2017-05-18 ENCOUNTER — Telehealth: Payer: Self-pay | Admitting: Adult Health

## 2017-05-18 NOTE — Telephone Encounter (Signed)
Pt aware US showed normal uterus and ovaries, EEC 7.3 mm

## 2017-05-24 ENCOUNTER — Ambulatory Visit (INDEPENDENT_AMBULATORY_CARE_PROVIDER_SITE_OTHER): Payer: BLUE CROSS/BLUE SHIELD | Admitting: Adult Health

## 2017-05-24 ENCOUNTER — Encounter: Payer: Self-pay | Admitting: Adult Health

## 2017-05-24 ENCOUNTER — Other Ambulatory Visit (HOSPITAL_COMMUNITY)
Admission: RE | Admit: 2017-05-24 | Discharge: 2017-05-24 | Disposition: A | Discharge status: Home / Self Care | Payer: BLUE CROSS/BLUE SHIELD | Source: Ambulatory Visit | Attending: Adult Health | Admitting: Adult Health

## 2017-05-24 VITALS — BP 104/62 | HR 77 | Resp 16 | Ht 63.5 in | Wt 143.0 lb

## 2017-05-24 DIAGNOSIS — Z1211 Encounter for screening for malignant neoplasm of colon: Secondary | ICD-10-CM

## 2017-05-24 DIAGNOSIS — Z1212 Encounter for screening for malignant neoplasm of rectum: Secondary | ICD-10-CM

## 2017-05-24 DIAGNOSIS — Z01419 Encounter for gynecological examination (general) (routine) without abnormal findings: Secondary | ICD-10-CM | POA: Insufficient documentation

## 2017-05-24 LAB — HEMOCCULT GUIAC POC 1CARD (OFFICE): Fecal Occult Blood, POC: NEGATIVE

## 2017-05-24 NOTE — Patient Instructions (Signed)
Physical in 1 year Pap in 2-3 if normal Mammogram yearly

## 2017-05-24 NOTE — Progress Notes (Signed)
Patient ID: Ashley Jarvis, female   DOB: 06-23-1971, 46 y.o.   MRN: [id number] History of Present Illness:  Ashley Jarvis is a 46 year old white female, married, G3P2 in for a well woman gyn exam and pap. PCP is Dr Phillips Odor.   Current Medications, Allergies, Past Medical History, Past Surgical History, Family History and Social History were reviewed in [employer] record.     Review of Systems: Patient denies any headaches, hearing loss, fatigue, blurred vision, shortness of breath, chest pain, abdominal pain, problems with bowel movements, urination, or intercourse. No joint pain or mood swings.    Physical Exam:BP 104/62 (BP Location: Right Arm, Patient Position: Sitting, Cuff Size: Normal)   Pulse 77   Resp 16   Ht 5' 3.5" (1.613 m)   Wt 143 lb (64.9 kg)   LMP 05/10/2017 (Approximate)   BMI 24.93 kg/m  General:  Well developed, well nourished, no acute distress Skin:  Warm and dry Neck:  Midline trachea, normal thyroid, good ROM, no lymphadenopathy Lungs; Clear to auscultation bilaterally Breast:  No dominant palpable mass, retraction, or nipple discharge, has fibroglandular tissue in left breast  Cardiovascular: Regular rate and rhythm Abdomen:  Soft, non tender, no hepatosplenomegaly Pelvic:  External genitalia is normal in appearance, no lesions.  The vagina is normal in appearance. Urethra has no lesions or masses. The cervix is smooth and  bulbous. Pap with HPV performed. Uterus is felt to be normal size, shape, and contour.  No adnexal masses or tenderness noted.Bladder is non tender, no masses felt. Rectal: Good sphincter tone, no polyps, or hemorrhoids felt.  Hemoccult negative. Extremities/musculoskeletal:  No swelling or varicosities noted, no clubbing or cyanosis Psych:  No mood changes, alert and cooperative,seems happy PHQ 2 score 0.  Impression: 1. Encounter for gynecological examination with Papanicolaou smear of cervix   2. Screening for  colorectal cancer       Plan: Physical in 1 year Pap in 2-3 if normal Mammogram yearly

## 2017-05-24 NOTE — Addendum Note (Signed)
Addended by: Tish Frederickson A on: 05/24/2017 02:40 PM   Modules accepted: Orders

## 2017-05-26 LAB — CYTOLOGY - PAP
Diagnosis: NEGATIVE
HPV: NOT DETECTED

## 2017-08-01 DIAGNOSIS — B029 Zoster without complications: Secondary | ICD-10-CM | POA: Diagnosis not present

## 2017-08-01 DIAGNOSIS — Z6824 Body mass index (BMI) 24.0-24.9, adult: Secondary | ICD-10-CM | POA: Diagnosis not present

## 2017-09-05 DIAGNOSIS — R51 Headache: Secondary | ICD-10-CM | POA: Diagnosis not present

## 2017-09-05 DIAGNOSIS — B2790 Infectious mononucleosis, unspecified without complication: Secondary | ICD-10-CM | POA: Diagnosis not present

## 2017-09-05 DIAGNOSIS — R768 Other specified abnormal immunological findings in serum: Secondary | ICD-10-CM | POA: Diagnosis not present

## 2017-09-05 DIAGNOSIS — R5381 Other malaise: Secondary | ICD-10-CM | POA: Diagnosis not present

## 2017-09-05 DIAGNOSIS — E559 Vitamin D deficiency, unspecified: Secondary | ICD-10-CM | POA: Diagnosis not present

## 2017-09-18 DIAGNOSIS — E039 Hypothyroidism, unspecified: Secondary | ICD-10-CM | POA: Diagnosis not present

## 2017-09-18 DIAGNOSIS — R5381 Other malaise: Secondary | ICD-10-CM | POA: Diagnosis not present

## 2017-09-18 DIAGNOSIS — R5383 Other fatigue: Secondary | ICD-10-CM | POA: Diagnosis not present

## 2017-10-16 DIAGNOSIS — J029 Acute pharyngitis, unspecified: Secondary | ICD-10-CM | POA: Diagnosis not present

## 2017-10-16 DIAGNOSIS — Z6825 Body mass index (BMI) 25.0-25.9, adult: Secondary | ICD-10-CM | POA: Diagnosis not present

## 2018-01-18 DIAGNOSIS — E559 Vitamin D deficiency, unspecified: Secondary | ICD-10-CM | POA: Diagnosis not present

## 2018-01-18 DIAGNOSIS — B2790 Infectious mononucleosis, unspecified without complication: Secondary | ICD-10-CM | POA: Diagnosis not present

## 2018-01-18 DIAGNOSIS — E039 Hypothyroidism, unspecified: Secondary | ICD-10-CM | POA: Diagnosis not present

## 2018-01-18 DIAGNOSIS — R6882 Decreased libido: Secondary | ICD-10-CM | POA: Diagnosis not present

## 2018-01-18 DIAGNOSIS — R5381 Other malaise: Secondary | ICD-10-CM | POA: Diagnosis not present

## 2018-02-05 DIAGNOSIS — R5381 Other malaise: Secondary | ICD-10-CM | POA: Diagnosis not present

## 2018-02-05 DIAGNOSIS — R5383 Other fatigue: Secondary | ICD-10-CM | POA: Diagnosis not present

## 2018-02-05 DIAGNOSIS — E039 Hypothyroidism, unspecified: Secondary | ICD-10-CM | POA: Diagnosis not present

## 2018-02-05 DIAGNOSIS — R6882 Decreased libido: Secondary | ICD-10-CM | POA: Diagnosis not present

## 2018-04-26 DIAGNOSIS — R5383 Other fatigue: Secondary | ICD-10-CM | POA: Diagnosis not present

## 2018-04-26 DIAGNOSIS — R6882 Decreased libido: Secondary | ICD-10-CM | POA: Diagnosis not present

## 2018-04-26 DIAGNOSIS — R5381 Other malaise: Secondary | ICD-10-CM | POA: Diagnosis not present

## 2018-04-26 DIAGNOSIS — E039 Hypothyroidism, unspecified: Secondary | ICD-10-CM | POA: Diagnosis not present

## 2018-05-29 DIAGNOSIS — H524 Presbyopia: Secondary | ICD-10-CM | POA: Diagnosis not present

## 2018-06-04 DIAGNOSIS — D225 Melanocytic nevi of trunk: Secondary | ICD-10-CM | POA: Diagnosis not present

## 2018-06-04 DIAGNOSIS — D2262 Melanocytic nevi of left upper limb, including shoulder: Secondary | ICD-10-CM | POA: Diagnosis not present

## 2018-06-04 DIAGNOSIS — L821 Other seborrheic keratosis: Secondary | ICD-10-CM | POA: Diagnosis not present

## 2018-06-04 DIAGNOSIS — Z86018 Personal history of other benign neoplasm: Secondary | ICD-10-CM | POA: Diagnosis not present

## 2018-06-21 DIAGNOSIS — R5381 Other malaise: Secondary | ICD-10-CM | POA: Diagnosis not present

## 2018-06-21 DIAGNOSIS — R6882 Decreased libido: Secondary | ICD-10-CM | POA: Diagnosis not present

## 2018-06-21 DIAGNOSIS — R5383 Other fatigue: Secondary | ICD-10-CM | POA: Diagnosis not present

## 2018-06-21 DIAGNOSIS — R1013 Epigastric pain: Secondary | ICD-10-CM | POA: Diagnosis not present

## 2018-06-27 ENCOUNTER — Other Ambulatory Visit: Payer: Self-pay | Admitting: Adult Health

## 2018-06-27 ENCOUNTER — Ambulatory Visit
Admission: RE | Admit: 2018-06-27 | Discharge: 2018-06-27 | Disposition: A | Discharge status: Home / Self Care | Payer: BLUE CROSS/BLUE SHIELD | Source: Ambulatory Visit | Attending: Adult Health | Admitting: Adult Health

## 2018-06-27 DIAGNOSIS — Z1231 Encounter for screening mammogram for malignant neoplasm of breast: Secondary | ICD-10-CM

## 2018-07-17 ENCOUNTER — Encounter (INDEPENDENT_AMBULATORY_CARE_PROVIDER_SITE_OTHER): Payer: Self-pay | Admitting: Internal Medicine

## 2018-07-17 ENCOUNTER — Ambulatory Visit (INDEPENDENT_AMBULATORY_CARE_PROVIDER_SITE_OTHER): Payer: BLUE CROSS/BLUE SHIELD | Admitting: Internal Medicine

## 2018-07-17 VITALS — BP 108/72 | HR 72 | Temp 97.7°F | Ht 63.5 in | Wt 147.6 lb

## 2018-07-17 DIAGNOSIS — K219 Gastro-esophageal reflux disease without esophagitis: Secondary | ICD-10-CM | POA: Diagnosis not present

## 2018-07-17 DIAGNOSIS — R0789 Other chest pain: Secondary | ICD-10-CM | POA: Diagnosis not present

## 2018-07-17 MED ORDER — OMEPRAZOLE 40 MG PO CPDR: 40.0000 mg | DELAYED_RELEASE_CAPSULE | Freq: Every day | ORAL | 3 refills | Status: DC

## 2018-07-17 NOTE — Patient Instructions (Signed)
The risks of bleeding, perforation and infection were reviewed with patient.  

## 2018-07-17 NOTE — Progress Notes (Signed)
   Subjective:    Patient ID: Ashley GilbertMelinda Halder, female    DOB: 31-Jan-1971, 47 y.o.   MRN: [id number]  HPI Referred by Dr Angelena SoleWeston Saunders for dysphagia. She tells me for the past month, she has pain in her esophagus.  She says foods are not lodging. She has pain when she eats but not every time. She points to area between her breast where the pain is located.  She denies any acid reflux. Only had GERD when she was pregnant.  The majority of the pain, is when she eats.  No family hx of colon cancer.  No NSAIDs.  Review of Systems Past Medical History:  Diagnosis Date  . Breast cyst   . History of abnormal cervical Pap smear 02/26/2014  . Hypothyroidism     Past Surgical History:  Procedure Laterality Date  . APPENDECTOMY    . I&D EXTREMITY Right 01/24/2014   Procedure: IRRIGATION AND DEBRIDEMENT RIGHT THUMB AND IP JOINT AND FLEXOR TENDON SHEATH;  Surgeon: Tami RibasKevin R Kuzma, MD;  Location: [facility] OR;  Service: Orthopedics;  Laterality: Right;    Allergies  Allergen Reactions  . Sulfa Antibiotics     Eyes swelled    Current Outpatient Medications on File Prior to Visit  Medication Sig Dispense Refill  . Magnesium Oxide (MAG-CAPS PO) Take 2 tablets by mouth daily.    . Nutritional Supplements (JUICE PLUS FIBRE PO) Take 1 tablet by mouth.    . Omega-3 Fatty Acids (OMEGA-3 FISH OIL PO) Take by mouth.    . thyroid (ARMOUR THYROID) 60 MG tablet Take 60 mg by mouth daily.     No current facility-administered medications on file prior to visit.         Objective:   Physical Exam Blood pressure 108/72, pulse 72, temperature 97.7 F (36.5 C), height 5' 3.5" (1.613 m), weight 147 lb 9.6 oz (67 kg). Alert and oriented. Skin warm and dry. Oral mucosa is moist.   . Sclera anicteric, conjunctivae is pink. Thyroid not enlarged. No cervical lymphadenopathy. Lungs clear. Heart regular rate and rhythm.  Abdomen is soft. Bowel sounds are positive. No hepatomegaly. No abdominal masses felt. No tenderness.   No edema to lower extremities.           Assessment & Plan:  Chest pain, GERD. Needs an EGD to rule out esophagitis Rx for Omeprazole sent to her pharmacy.

## 2018-07-18 ENCOUNTER — Encounter (INDEPENDENT_AMBULATORY_CARE_PROVIDER_SITE_OTHER): Payer: Self-pay | Admitting: *Deleted

## 2018-07-18 DIAGNOSIS — R0789 Other chest pain: Secondary | ICD-10-CM | POA: Insufficient documentation

## 2018-07-18 DIAGNOSIS — K219 Gastro-esophageal reflux disease without esophagitis: Secondary | ICD-10-CM | POA: Insufficient documentation

## 2018-08-16 ENCOUNTER — Telehealth: Payer: Self-pay | Admitting: *Deleted

## 2018-08-16 ENCOUNTER — Ambulatory Visit: Payer: BLUE CROSS/BLUE SHIELD | Admitting: Adult Health

## 2018-08-16 ENCOUNTER — Encounter: Payer: Self-pay | Admitting: Adult Health

## 2018-08-16 VITALS — BP 107/62 | HR 71 | Ht 63.5 in | Wt 149.0 lb

## 2018-08-16 DIAGNOSIS — N61 Mastitis without abscess: Secondary | ICD-10-CM | POA: Diagnosis not present

## 2018-08-16 MED ORDER — AMOXICILLIN-POT CLAVULANATE 875-125 MG PO TABS: 1.0000 | ORAL_TABLET | Freq: Two times a day (BID) | ORAL | 0 refills | Status: DC

## 2018-08-16 NOTE — Progress Notes (Addendum)
Patient ID: Ashley Jarvis, female   DOB: 03-21-1971, 48 y.o.   MRN: [id number] History of Present Illness: Ashley Jarvis… is a 48 year old white female, married, in complaining of waking up and has redness and mass right breast. Had normal mammogram 06/27/2018.    Current Medications, Allergies, Past Medical History, Past Surgical History, Family History and Social History were reviewed in [employer] record.     Review of Systems: Redness and mass right breast, noticed this am, tender and itchy    Physical Exam:BP 107/62 (BP Location: Left Arm, Patient Position: Sitting, Cuff Size: Normal)   Pulse 71   Ht 5' 3.5" (1.613 m)   Wt 149 lb (67.6 kg)   LMP 08/05/2018 (Approximate)   BMI 25.98 kg/m  General:  Well developed, well nourished, no acute distress Skin:  Warm and dry Breast:  No dominant palpable mass, retraction, or nipple discharge on left, on right, no retraction or nipple discharge, but has 4 x 6 cm oval mass, tender at 11-1 o'clock, and redness over that area Psych:  No mood changes, alert and cooperative,seems happy Fall risk is low. Her daughter was in room during exam.   Impression: 1. Mastitis, right, acute       Plan: Meds ordered this encounter  Medications  . amoxicillin-clavulanate (AUGMENTIN) 875-125 MG tablet    Sig: Take 1 tablet by mouth 2 (two) times daily.    Dispense:  20 tablet    Refill:  0    Order Specific Question:   Supervising Provider    Answer:   Duane Lope H [[id number]]   F/U in 2 weeks, if not resolved may get [place] Review handout on mastitis Can use warm or cold compress if needed

## 2018-08-16 NOTE — Telephone Encounter (Signed)
Has red area on breast, to come at 1;30 to be checked

## 2018-08-16 NOTE — Patient Instructions (Signed)
Mastitis    Mastitis is inflammation of the breast tissue. It occurs most often in women who are breastfeeding, but it can also affect other women, and sometimes even men.  What are the causes?  This condition is usually caused by a bacterial infection. Bacteria enter the breast tissue through cuts or openings in the skin. Typically, this occurs with breastfeeding because of cracked or irritated nipples. Sometimes, it can occur when there is no opening in the skin. This is usually caused by plugged milk ducts.  Other causes include:   Nipple piercing.   Some forms of breast cancer.  What are the signs or symptoms?  Symptoms of this condition include:   Swelling, redness, tenderness, and pain in an area of the breast. The area may also feel warm to the touch. These symptoms usually affect the upper part of the breast, toward the armpit region.   Swelling of the glands under the arm on the same side.   Fever.   Rapid pulse.   Fatigue, headache, and flu-like muscle aches.  If an infection is allowed to progress, a collection of pus (abscess) may develop.  How is this diagnosed?  This condition can usually be diagnosed based on a physical exam and your symptoms. You may also have other tests, such as:   Blood tests to determine if your body is fighting a bacterial infection.   Mammogram or ultrasound tests to rule out other problems or diseases.   Testing of pus and other fluids. Pus from the breast may be collected and examined in the lab. If an abscess has developed, the fluid in the abscess can be removed with a needle. This test can be used to confirm the diagnosis and identify the bacteria present.   If you are breastfeeding, breast milk may be cultured and tested for bacteria.  How is this treated?  Treatment for this condition may include:   Applying heat or cold compresses to the affected area.   Medicine for pain.   Antibiotic medicine to treat a bacterial infection. This is usually taken by  mouth.   Self-care such as rest and increased fluid intake.   If an abscess has developed, it may be treated by removing fluid with a needle.  Mastitis that occurs with breastfeeding will sometimes go away on its own, so your health care provider may choose to wait 24 hours after first seeing you to decide whether a prescription medicine is needed. You may be told of different ways to help manage breastfeeding, such as continuing to breastfeed or pump in order to ensure adequate milk flow.  Follow these instructions at home:  Medicines   Take over-the-counter and prescription medicines only as told by your health care provider.   If you were prescribed an antibiotic medicine, take it as told by your health care provider. Do not stop taking the antibiotic even if you start to feel better.  General instructions   Do not wear a tight or underwire bra. Wear a soft, supportive bra.   Increase your fluid intake, especially if you have a fever.   Get plenty of rest.  If you are breastfeeding:   Continue to empty your breasts as often as possible either by breastfeeding or by using a breast pump. This will decrease the pressure and the pain that comes with it. Ask your health care provider if changes need to be made to your breastfeeding or pumping routine.   Keep your nipples clean and dry.     During breastfeeding, empty the first breast completely before going to the other breast. If your baby is not emptying your breasts completely, use a breast pump to empty your breasts.   Use breast massage during feeding or pumping sessions.   If directed, apply moist heat to the affected area of your breast right before breastfeeding or pumping. Use the heat source that your health care provider recommends.   If directed, put ice on the affected area of your breast right after breastfeeding or pumping:  ? Put ice in a plastic bag.  ? Place a towel between your skin and the bag.  ? Leave the ice on for 20 minutes.   If  you go back to work, pump your breasts while at work to stay within your nursing schedule.   Avoid allowing your breasts to become overly filled with milk (engorged).  Contact a health care provider if:   You have pus-like discharge from the breast.   You have a fever.   Your symptoms do not improve within 2 days of starting treatment.  Get help right away if:   Your pain and swelling are getting worse.   You have pain that is not controlled with medicine.   You have a red line extending from the breast toward your armpit.  Summary   Mastitis is inflammation of the breast tissue. It occurs most often in women who are breastfeeding, but it can also affect non-breastfeeding women and some men.   This condition is usually caused by a bacterial infection.   This condition may be treated with hot and cold compresses, medicines, self-care, and certain breastfeeding strategies.   If you were prescribed an antibiotic medicine, take it as told by your health care provider. Do not stop taking the antibiotic even if you start to feel better.  This information is not intended to replace advice given to you by your health care provider. Make sure you discuss any questions you have with your health care provider.  Document Released: 08/01/2005 Document Revised: 08/23/2016 Document Reviewed: 08/23/2016  Elsevier Interactive Patient Education  2019 [employer]

## 2018-08-30 ENCOUNTER — Ambulatory Visit: Payer: BLUE CROSS/BLUE SHIELD | Admitting: Adult Health

## 2018-08-30 ENCOUNTER — Encounter: Payer: Self-pay | Admitting: Adult Health

## 2018-08-30 VITALS — BP 99/66 | HR 72 | Ht 63.5 in | Wt 148.0 lb

## 2018-08-30 DIAGNOSIS — Z87898 Personal history of other specified conditions: Secondary | ICD-10-CM

## 2018-08-30 DIAGNOSIS — N6310 Unspecified lump in the right breast, unspecified quadrant: Secondary | ICD-10-CM | POA: Diagnosis not present

## 2018-08-30 NOTE — Progress Notes (Signed)
Patient ID: Ashley Jarvis, female   DOB: June 21, 1971, 48 y.o.   MRN: [id number] History of Present Illness:  Ashley Jarvis is a 48 year old white female, married back in follow up after being treated for mastitis 08/16/2018 of right breast and is much better.  Current Medications, Allergies, Past Medical History, Past Surgical History, Family History and Social History were reviewed in [employer] record.     Review of Systems: Redness has gone on right breast knot still there, is not tender any longer  Has noticed slight thickness/bulge lower abdomen    Physical Exam:BP 99/66 (BP Location: Left Arm, Patient Position: Sitting, Cuff Size: Normal)   Pulse 72   Ht 5' 3.5" (1.613 m)   Wt 148 lb (67.1 kg)   LMP 08/05/2018 (Approximate)   BMI 25.81 kg/m  General:  Well developed, well nourished, no acute distress Skin:  Warm and dry Breast:  No dominant palpable mass, retraction, or nipple discharge on left has fibroglandular tissue, on right, no retraction or nipple discharge, and mass at 11 o'clock is smaller at 2 x 3 cm and tenderness and redness have resolved. Abdomen:  Soft, non tender, no hernia noted, may be laxity of skin  Psych:  No mood changes, alert and cooperative,seems happy I told her I think will will resolve, but will recheck in 2 weeks    Impression: 1. History of mastitis   2. Breast mass, right       Plan: Will recheck in 2 weeks, if persists will get [place], (had normal mammogram in November)

## 2018-09-13 ENCOUNTER — Encounter: Payer: Self-pay | Admitting: Adult Health

## 2018-09-13 ENCOUNTER — Ambulatory Visit: Payer: BLUE CROSS/BLUE SHIELD | Admitting: Adult Health

## 2018-09-13 VITALS — BP 110/73 | HR 78 | Ht 63.5 in | Wt 150.0 lb

## 2018-09-13 DIAGNOSIS — Z87898 Personal history of other specified conditions: Secondary | ICD-10-CM | POA: Diagnosis not present

## 2018-09-13 NOTE — Progress Notes (Signed)
Patient ID: Ashley Jarvis, female   DOB: September 29, 1970, 48 y.o.   MRN: [id number] History of Present Illness: Ashley Jarvis is a 48 year old white female, married, back in follow up of mastitis with mass in right breast and all is good, mass resolved.    Current Medications, Allergies, Past Medical History, Past Surgical History, Family History and Social History were reviewed in [employer] record.     Review of Systems: No pain, mass resolved    Physical Exam:BP 110/73 (BP Location: Left Arm, Patient Position: Sitting, Cuff Size: Normal)   Pulse 78   Ht 5' 3.5" (1.613 m)   Wt 150 lb (68 kg)   LMP 09/03/2018 (Approximate)   BMI 26.15 kg/m  General:  Well developed, well nourished, no acute distress Skin:  Warm and dry Breast:  No dominant palpable mass, retraction, or nipple discharge, no redness, or tenderness on right, does have bilateral regular irregularities  Psych:  No mood changes, alert and cooperative,seems happy   Impression: 1. History of mastitis       Plan: Check breast regularly F/U prn

## 2018-09-24 ENCOUNTER — Encounter (INDEPENDENT_AMBULATORY_CARE_PROVIDER_SITE_OTHER): Payer: Self-pay | Admitting: *Deleted

## 2018-10-18 ENCOUNTER — Ambulatory Visit (HOSPITAL_COMMUNITY)
Admission: RE | Admit: 2018-10-18 | Discharge: 2018-10-18 | Disposition: A | Discharge status: Home / Self Care | Payer: BLUE CROSS/BLUE SHIELD | Attending: Internal Medicine | Admitting: Internal Medicine

## 2018-10-18 ENCOUNTER — Other Ambulatory Visit: Payer: Self-pay

## 2018-10-18 ENCOUNTER — Encounter (HOSPITAL_COMMUNITY)
Admission: RE | Disposition: A | Discharge status: Home / Self Care | Payer: Self-pay | Source: Home / Self Care | Attending: Internal Medicine

## 2018-10-18 ENCOUNTER — Encounter (HOSPITAL_COMMUNITY): Payer: Self-pay | Admitting: *Deleted

## 2018-10-18 DIAGNOSIS — Z79890 Hormone replacement therapy (postmenopausal): Secondary | ICD-10-CM | POA: Diagnosis not present

## 2018-10-18 DIAGNOSIS — Z79899 Other long term (current) drug therapy: Secondary | ICD-10-CM | POA: Diagnosis not present

## 2018-10-18 DIAGNOSIS — Z791 Long term (current) use of non-steroidal anti-inflammatories (NSAID): Secondary | ICD-10-CM | POA: Insufficient documentation

## 2018-10-18 DIAGNOSIS — E039 Hypothyroidism, unspecified: Secondary | ICD-10-CM | POA: Insufficient documentation

## 2018-10-18 DIAGNOSIS — R0789 Other chest pain: Secondary | ICD-10-CM | POA: Insufficient documentation

## 2018-10-18 DIAGNOSIS — R079 Chest pain, unspecified: Secondary | ICD-10-CM | POA: Insufficient documentation

## 2018-10-18 DIAGNOSIS — R1314 Dysphagia, pharyngoesophageal phase: Secondary | ICD-10-CM | POA: Diagnosis not present

## 2018-10-18 DIAGNOSIS — R1310 Dysphagia, unspecified: Secondary | ICD-10-CM | POA: Insufficient documentation

## 2018-10-18 DIAGNOSIS — K219 Gastro-esophageal reflux disease without esophagitis: Secondary | ICD-10-CM | POA: Insufficient documentation

## 2018-10-18 HISTORY — PX: ESOPHAGOGASTRODUODENOSCOPY: SHX5428

## 2018-10-18 SURGERY — EGD (ESOPHAGOGASTRODUODENOSCOPY)
Anesthesia: Moderate Sedation

## 2018-10-18 MED ORDER — MIDAZOLAM HCL 5 MG/5ML IJ SOLN
INTRAMUSCULAR | Status: DC | PRN
  Administered 2018-10-18 (×3): 2 mg via INTRAVENOUS

## 2018-10-18 MED ORDER — SODIUM CHLORIDE 0.9 % IV SOLN
INTRAVENOUS | Status: DC
  Administered 2018-10-18: 13:00:00 via INTRAVENOUS

## 2018-10-18 MED ORDER — OMEPRAZOLE 20 MG PO CPDR: 20.0000 mg | DELAYED_RELEASE_CAPSULE | Freq: Every day | ORAL | 5 refills | Status: DC | PRN

## 2018-10-18 MED ORDER — MIDAZOLAM HCL 5 MG/5ML IJ SOLN
INTRAMUSCULAR | Status: AC
  Filled 2018-10-18: qty 10

## 2018-10-18 MED ORDER — LIDOCAINE VISCOUS HCL 2 % MT SOLN
OROMUCOSAL | Status: AC
  Filled 2018-10-18: qty 15

## 2018-10-18 MED ORDER — MEPERIDINE HCL 50 MG/ML IJ SOLN
INTRAMUSCULAR | Status: DC | PRN
  Administered 2018-10-18 (×2): 25 mg via INTRAVENOUS

## 2018-10-18 MED ORDER — LIDOCAINE VISCOUS HCL 2 % MT SOLN
OROMUCOSAL | Status: DC | PRN
  Administered 2018-10-18: 4 mL via OROMUCOSAL

## 2018-10-18 MED ORDER — MEPERIDINE HCL 100 MG/ML IJ SOLN
INTRAMUSCULAR | Status: AC
  Filled 2018-10-18: qty 1

## 2018-10-18 MED ORDER — STERILE WATER FOR IRRIGATION IR SOLN
Status: DC | PRN
  Administered 2018-10-18: 15:00:00

## 2018-10-18 NOTE — H&P (Signed)
Ashley Jarvis is an 48 y.o. female.   Chief Complaint: Patient is here for EGD. HPI: Patient is 48 year old Caucasian female who is been having intermittent painful swallowing.  She says few episodes resulted in intense pain lasting for 5 minutes or so.  She was seen her office in December 2019 and begun on omeprazole.  She says she has had 2 episodes since then but these have been mild.  She points to the lower sternal area as site of pain.  She denies heartburn.  She says she only had 1 episode during her pregnancy.  She denies nausea vomiting or melena.  She has good appetite and her weight has been stable. No history of antibiotic use prior to onset of the symptoms but she takes Aleve sporadically for migraine.  Past Medical History:  Diagnosis Date  . Breast cyst   . History of abnormal cervical Pap smear 02/26/2014  . Hypothyroidism     Past Surgical History:  Procedure Laterality Date  . APPENDECTOMY    . I&D EXTREMITY Right 01/24/2014   Procedure: IRRIGATION AND DEBRIDEMENT RIGHT THUMB AND IP JOINT AND FLEXOR TENDON SHEATH;  Surgeon: Tami Ribas, MD;  Location: [facility] OR;  Service: Orthopedics;  Laterality: Right;    Family History  Problem Relation Age of Onset  . Asthma Mother   . Coronary artery disease Paternal Grandfather   . Cancer Paternal Grandfather        bladder  . Cancer Paternal Grandmother        lung  . Coronary artery disease Paternal Grandmother   . Coronary artery disease Maternal Grandmother   . Coronary artery disease Maternal Grandfather   . Hypertension Father   . Hypertension Other   . Stroke Other   . Cancer Other        lung, cervical   . Coronary artery disease Other    Social History:  reports that she has never smoked. She has never used smokeless tobacco. She reports current alcohol use. She reports that she does not use drugs.  Allergies:  Allergies  Allergen Reactions  . Sulfa Antibiotics Swelling    Eyes swelled    Medications Prior to  Admission  Medication Sig Dispense Refill  . ibuprofen (ADVIL,MOTRIN) 200 MG tablet Take 400 mg by mouth every 6 (six) hours as needed (headaches.).    Marland Kitchen Magnesium Oxide (MAG-CAPS PO) Take 1-2 tablets by mouth at bedtime.     . naproxen sodium (ALEVE) 220 MG tablet Take 440 mg by mouth 2 (two) times daily as needed (headaches.).    Marland Kitchen Nutritional Supplements (JUICE PLUS FIBRE PO) Take 6 tablets by mouth daily. 2 capsules of Fruit-Berry-Vegetable    . Omega-3 Fatty Acids (OMEGA-3 FISH OIL PO) Take 2 capsules by mouth daily. Plant based    . Progesterone Micronized (PROGESTERONE PO) Take 1 capsule by mouth at bedtime.   2  . thyroid (ARMOUR THYROID) 60 MG tablet Take 60 mg by mouth daily.    Marland Kitchen UNABLE TO FIND Testosterone-pellet every 4 months Bioidentical hormones      No results found for this or any previous visit (from the past 48 hour(s)). No results found.  ROS  Blood pressure 119/82, pulse 97, temperature 98.8 F (37.1 C), temperature source Oral, resp. rate 18, height 5' 3.5" (1.613 m), weight 67 kg, last menstrual period 09/29/2018, SpO2 99 %. Physical Exam  Constitutional: She appears well-developed and well-nourished.  HENT:  Mouth/Throat: Oropharynx is clear and moist.  Eyes: Conjunctivae  are normal. No scleral icterus.  Neck: No thyromegaly present.  Cardiovascular: Normal rate, regular rhythm and normal heart sounds.  No murmur heard. Respiratory: Effort normal and breath sounds normal.  GI:  Abdomen is symmetrical.  She has horizontal scar in right lower/mid quadrant.  Abdomen is soft mild midepigastric tenderness.  No organomegaly or masses.  Musculoskeletal:        General: No edema.  Lymphadenopathy:    She has no cervical adenopathy.  Neurological: She is alert.  Skin: Skin is warm and dry.     Assessment/Plan History of odynophagia/chest pain. Diagnostic EGD.  Lionel December, MD 10/18/2018, 2:25 PM

## 2018-10-18 NOTE — Op Note (Signed)
[facility] Patient Name: Ashley Jarvis Procedure Date: 10/18/2018 2:18 PM MRN: [id number] Date of Birth: 12-20-1970 Attending MD: Lionel December , MD CSN: [id number] Age: 48 Admit Type: Ambulatory Procedure:                Upper GI endoscopy Indications:              Odynophagia Providers:                Lionel December, MD, Criselda Peaches. Patsy Lager, RN, Edythe Clarity, Technician Referring MD:             Corrie Mckusick, MD Medicines:                Lidocaine spray, Meperidine 50 mg IV, Midazolam 6                            mg IV Complications:            No immediate complications. Estimated Blood Loss:     Estimated blood loss: none. Procedure:                Pre-Anesthesia Assessment:                           - Prior to the procedure, a History and Physical                            was performed, and patient medications and                            allergies were reviewed. The patient's tolerance of                            previous anesthesia was also reviewed. The risks                            and benefits of the procedure and the sedation                            options and risks were discussed with the patient.                            All questions were answered, and informed consent                            was obtained. Prior Anticoagulants: The patient                            last took naproxen 7 days prior to the procedure.                            ASA Grade Assessment: I - A normal, healthy  patient. After reviewing the risks and benefits,                            the patient was deemed in satisfactory condition to                            undergo the procedure.                           After obtaining informed consent, the endoscope was                            passed under direct vision. Throughout the                            procedure, the patient's blood pressure, pulse, and                 oxygen saturations were monitored continuously. The                            GIF-H190 ([id number]) scope was introduced through the                            mouth, and advanced to the second part of duodenum.                            The upper GI endoscopy was accomplished without                            difficulty. The patient tolerated the procedure                            well. Scope In: 2:37:07 PM Scope Out: 2:42:17 PM Total Procedure Duration: 0 hours 5 minutes 10 seconds  Findings:      The examined esophagus was normal.      The Z-line was regular and was found 38 cm from the incisors.      The entire examined stomach was normal.      The duodenal bulb and second portion of the duodenum were normal. Impression:               - Normal esophagus.                           - Z-line regular, 38 cm from the incisors.                           - Normal stomach.                           - Normal duodenal bulb and second portion of the                            duodenum.                           -  No specimens collected. Moderate Sedation:      Moderate (conscious) sedation was administered by the endoscopy nurse       and supervised by the endoscopist. The following parameters were       monitored: oxygen saturation, heart rate, blood pressure, CO2       capnography and response to care. Total physician intraservice time was       12 minutes. Recommendation:           - Patient has a contact number available for                            emergencies. The signs and symptoms of potential                            delayed complications were discussed with the                            patient. Return to normal activities tomorrow.                            Written discharge instructions were provided to the                            patient.                           - Resume previous diet today.                           - Continue present medications.                            - Telephone GI clinic with progress report in 8 to                            12 weeks. Procedure Code(s):        --- Professional ---                           [phone], Esophagogastroduodenoscopy, flexible,                            transoral; diagnostic, including collection of                            specimen(s) by brushing or washing, when performed                            (separate procedure)                           G0500, Moderate sedation services provided by the                            same physician or other qualified health care  professional performing a gastrointestinal                            endoscopic service that sedation supports,                            requiring the presence of an independent trained                            observer to assist in the monitoring of the                            patient's level of consciousness and physiological                            status; initial 15 minutes of intra-service time;                            patient age 60 years or older (additional time may                            be reported with 62130, as appropriate) Diagnosis Code(s):        --- Professional ---                           R13.10, Dysphagia, unspecified CPT copyright 2018 American Medical Association. All rights reserved. The codes documented in this report are preliminary and upon coder review may  be revised to meet current compliance requirements. Lionel December, MD Lionel December, MD 10/18/2018 2:50:56 PM This report has been signed electronically. Number of Addenda: 0

## 2018-10-18 NOTE — Discharge Instructions (Signed)
Resume usual medications as before. Resume usual diet. No driving for 24 hours. Call office with progress report in 2 to 3 months.   Upper Endoscopy, Adult, Care After This sheet gives you information about how to care for yourself after your procedure. Your health care provider may also give you more specific instructions. If you have problems or questions, contact your health care provider. What can I expect after the procedure? After the procedure, it is common to have:  A sore throat.  Mild stomach pain or discomfort.  Bloating.  Nausea. Follow these instructions at home:   Follow instructions from your health care provider about what to eat or drink after your procedure.  Return to your normal activities as told by your health care provider. Ask your health care provider what activities are safe for you.  Take over-the-counter and prescription medicines only as told by your health care provider.  Do not drive for 24 hours if you were given a sedative during your procedure.  Keep all follow-up visits as told by your health care provider. This is important. Contact a health care provider if you have:  A sore throat that lasts longer than one day.  Trouble swallowing. Get help right away if:  You vomit blood or your vomit looks like coffee grounds.  You have: ? A fever. ? Bloody, black, or tarry stools. ? A severe sore throat or you cannot swallow. ? Difficulty breathing. ? Severe pain in your chest or abdomen. Summary  After the procedure, it is common to have a sore throat, mild stomach discomfort, bloating, and nausea.  Do not drive for 24 hours if you were given a sedative during the procedure.  Follow instructions from your health care provider about what to eat or drink after your procedure.  Return to your normal activities as told by your health care provider. This information is not intended to replace advice given to you by your health care provider.  Make sure you discuss any questions you have with your health care provider. Document Released: 01/31/2012 Document Revised: 01/01/2018 Document Reviewed: 01/01/2018 Elsevier Interactive Patient Education  2019 [employer].

## 2018-10-19 DIAGNOSIS — E039 Hypothyroidism, unspecified: Secondary | ICD-10-CM | POA: Diagnosis not present

## 2018-10-19 DIAGNOSIS — R5381 Other malaise: Secondary | ICD-10-CM | POA: Diagnosis not present

## 2018-10-19 DIAGNOSIS — R6882 Decreased libido: Secondary | ICD-10-CM | POA: Diagnosis not present

## 2018-10-19 DIAGNOSIS — B2790 Infectious mononucleosis, unspecified without complication: Secondary | ICD-10-CM | POA: Diagnosis not present

## 2018-10-23 ENCOUNTER — Encounter (HOSPITAL_COMMUNITY): Payer: Self-pay | Admitting: Internal Medicine

## 2019-05-27 ENCOUNTER — Other Ambulatory Visit: Payer: Self-pay | Admitting: Adult Health

## 2019-05-27 DIAGNOSIS — Z1231 Encounter for screening mammogram for malignant neoplasm of breast: Secondary | ICD-10-CM

## 2019-06-06 DIAGNOSIS — N943 Premenstrual tension syndrome: Secondary | ICD-10-CM | POA: Diagnosis not present

## 2019-06-06 DIAGNOSIS — E039 Hypothyroidism, unspecified: Secondary | ICD-10-CM | POA: Diagnosis not present

## 2019-06-06 DIAGNOSIS — E559 Vitamin D deficiency, unspecified: Secondary | ICD-10-CM | POA: Diagnosis not present

## 2019-06-06 DIAGNOSIS — R5381 Other malaise: Secondary | ICD-10-CM | POA: Diagnosis not present

## 2019-06-06 DIAGNOSIS — R768 Other specified abnormal immunological findings in serum: Secondary | ICD-10-CM | POA: Diagnosis not present

## 2019-07-01 DIAGNOSIS — R52 Pain, unspecified: Secondary | ICD-10-CM | POA: Diagnosis not present

## 2019-07-01 DIAGNOSIS — R0981 Nasal congestion: Secondary | ICD-10-CM | POA: Diagnosis not present

## 2019-07-01 DIAGNOSIS — J029 Acute pharyngitis, unspecified: Secondary | ICD-10-CM | POA: Diagnosis not present

## 2019-07-01 DIAGNOSIS — R05 Cough: Secondary | ICD-10-CM | POA: Diagnosis not present

## 2019-07-17 ENCOUNTER — Telehealth: Payer: Self-pay | Admitting: Adult Health

## 2019-07-17 ENCOUNTER — Other Ambulatory Visit: Payer: Self-pay

## 2019-07-17 ENCOUNTER — Ambulatory Visit
Admission: RE | Admit: 2019-07-17 | Discharge: 2019-07-17 | Disposition: A | Discharge status: Home / Self Care | Payer: BLUE CROSS/BLUE SHIELD | Source: Ambulatory Visit | Attending: Adult Health | Admitting: Adult Health

## 2019-07-17 DIAGNOSIS — Z1231 Encounter for screening mammogram for malignant neoplasm of breast: Secondary | ICD-10-CM | POA: Diagnosis not present

## 2019-07-17 NOTE — Telephone Encounter (Signed)
Pt aware that she can expect cal about getting follow up on mammogram ?mass in right breast.. she says it is usually the left.

## 2019-07-18 ENCOUNTER — Other Ambulatory Visit: Payer: Self-pay | Admitting: Adult Health

## 2019-07-18 DIAGNOSIS — R928 Other abnormal and inconclusive findings on diagnostic imaging of breast: Secondary | ICD-10-CM

## 2019-07-23 ENCOUNTER — Other Ambulatory Visit: Payer: Self-pay

## 2019-07-23 ENCOUNTER — Ambulatory Visit
Admission: RE | Admit: 2019-07-23 | Discharge: 2019-07-23 | Disposition: A | Discharge status: Home / Self Care | Payer: BC Managed Care – PPO | Source: Ambulatory Visit | Attending: Adult Health | Admitting: Adult Health

## 2019-07-23 DIAGNOSIS — R928 Other abnormal and inconclusive findings on diagnostic imaging of breast: Secondary | ICD-10-CM

## 2019-07-23 DIAGNOSIS — N6312 Unspecified lump in the right breast, upper inner quadrant: Secondary | ICD-10-CM | POA: Diagnosis not present

## 2019-07-23 DIAGNOSIS — R922 Inconclusive mammogram: Secondary | ICD-10-CM | POA: Diagnosis not present

## 2019-08-13 DIAGNOSIS — R5383 Other fatigue: Secondary | ICD-10-CM | POA: Diagnosis not present

## 2019-08-13 DIAGNOSIS — R6882 Decreased libido: Secondary | ICD-10-CM | POA: Diagnosis not present

## 2019-08-13 DIAGNOSIS — R5381 Other malaise: Secondary | ICD-10-CM | POA: Diagnosis not present

## 2019-08-13 DIAGNOSIS — E039 Hypothyroidism, unspecified: Secondary | ICD-10-CM | POA: Diagnosis not present

## 2019-11-11 DIAGNOSIS — H5212 Myopia, left eye: Secondary | ICD-10-CM | POA: Diagnosis not present

## 2019-12-20 DIAGNOSIS — R7982 Elevated C-reactive protein (CRP): Secondary | ICD-10-CM | POA: Diagnosis not present

## 2019-12-20 DIAGNOSIS — E039 Hypothyroidism, unspecified: Secondary | ICD-10-CM | POA: Diagnosis not present

## 2019-12-20 DIAGNOSIS — R5381 Other malaise: Secondary | ICD-10-CM | POA: Diagnosis not present

## 2019-12-20 DIAGNOSIS — R6882 Decreased libido: Secondary | ICD-10-CM | POA: Diagnosis not present

## 2019-12-30 DIAGNOSIS — R5383 Other fatigue: Secondary | ICD-10-CM | POA: Diagnosis not present

## 2019-12-30 DIAGNOSIS — E039 Hypothyroidism, unspecified: Secondary | ICD-10-CM | POA: Diagnosis not present

## 2019-12-30 DIAGNOSIS — R6882 Decreased libido: Secondary | ICD-10-CM | POA: Diagnosis not present

## 2019-12-30 DIAGNOSIS — R5381 Other malaise: Secondary | ICD-10-CM | POA: Diagnosis not present

## 2020-01-14 DIAGNOSIS — R6882 Decreased libido: Secondary | ICD-10-CM | POA: Diagnosis not present

## 2020-01-14 DIAGNOSIS — R5381 Other malaise: Secondary | ICD-10-CM | POA: Diagnosis not present

## 2020-01-14 DIAGNOSIS — E039 Hypothyroidism, unspecified: Secondary | ICD-10-CM | POA: Diagnosis not present

## 2020-01-14 DIAGNOSIS — R7982 Elevated C-reactive protein (CRP): Secondary | ICD-10-CM | POA: Diagnosis not present

## 2020-01-14 DIAGNOSIS — R5383 Other fatigue: Secondary | ICD-10-CM | POA: Diagnosis not present

## 2020-05-28 ENCOUNTER — Ambulatory Visit
Admission: EM | Admit: 2020-05-28 | Discharge: 2020-05-28 | Disposition: A | Discharge status: Home / Self Care | Payer: BC Managed Care – PPO | Attending: Emergency Medicine | Admitting: Emergency Medicine

## 2020-05-28 ENCOUNTER — Other Ambulatory Visit: Payer: Self-pay

## 2020-05-28 DIAGNOSIS — J209 Acute bronchitis, unspecified: Secondary | ICD-10-CM | POA: Diagnosis not present

## 2020-05-28 MED ORDER — DEXAMETHASONE 4 MG PO TABS: 4.0000 mg | ORAL_TABLET | Freq: Every day | ORAL | 0 refills | Status: AC

## 2020-05-28 MED ORDER — AZITHROMYCIN 250 MG PO TABS: 250.0000 mg | ORAL_TABLET | Freq: Every day | ORAL | 0 refills | Status: DC

## 2020-05-28 MED ORDER — BENZONATATE 100 MG PO CAPS: 100.0000 mg | ORAL_CAPSULE | Freq: Three times a day (TID) | ORAL | 0 refills | Status: DC

## 2020-05-28 NOTE — ED Provider Notes (Addendum)
[facility]   [id number] 05/28/20 Arrival Time: 2376   Chief Complaint  Patient presents with  . Otalgia  . Cough     SUBJECTIVE: History from: patient.  Ashley Jarvis is a 49 y.o. female who presented to the urgent care with a complaint of scratchy throat, ear pain, cough and congestion green nasal discharge for the past 4 days.  Denies sick exposure to COVID, flu or strep.  Denies recent travel.  Has tried OTC medication without relief.  Denies aggravating factor.  Denies previous symptoms in the past.   Denies fever, chills, fatigue, sinus pain, rhinorrhea, sore throat, SOB, wheezing, chest pain, nausea, changes in bowel or bladder habits.     ROS: As per HPI.  All other pertinent ROS negative.      Past Medical History:  Diagnosis Date  . Breast cyst   . History of abnormal cervical Pap smear 02/26/2014  . Hypothyroidism    Past Surgical History:  Procedure Laterality Date  . APPENDECTOMY    . ESOPHAGOGASTRODUODENOSCOPY N/A 10/18/2018   Procedure: ESOPHAGOGASTRODUODENOSCOPY (EGD);  Surgeon: Malissa Hippo, MD;  Location: [facility] SUITE;  Service: Endoscopy;  Laterality: N/A;  2:55-rescheduled to 3/5 @ 2:00pm per Dewayne Hatch  . I & D EXTREMITY Right 01/24/2014   Procedure: IRRIGATION AND DEBRIDEMENT RIGHT THUMB AND IP JOINT AND FLEXOR TENDON SHEATH;  Surgeon: Tami Ribas, MD;  Location: [facility] OR;  Service: Orthopedics;  Laterality: Right;   Allergies  Allergen Reactions  . Sulfa Antibiotics Swelling    Eyes swelled   No current facility-administered medications on file prior to encounter.   Current Outpatient Medications on File Prior to Encounter  Medication Sig Dispense Refill  . Magnesium Oxide (MAG-CAPS PO) Take 1-2 tablets by mouth at bedtime.     . naproxen sodium (ALEVE) 220 MG tablet Take 440 mg by mouth 2 (two) times daily as needed (headaches.).    Marland Kitchen Nutritional Supplements (JUICE PLUS FIBRE PO) Take 6 tablets by mouth daily. 2 capsules of  Fruit-Berry-Vegetable    . Omega-3 Fatty Acids (OMEGA-3 FISH OIL PO) Take 2 capsules by mouth daily. Plant based    . omeprazole (PRILOSEC) 20 MG capsule Take 1 capsule (20 mg total) by mouth daily as needed. 30 capsule 5  . Progesterone Micronized (PROGESTERONE PO) Take 1 capsule by mouth at bedtime.   2  . thyroid (ARMOUR THYROID) 60 MG tablet Take 60 mg by mouth daily.    Marland Kitchen UNABLE TO FIND Testosterone-pellet every 4 months Bioidentical hormones     Social History   Socioeconomic History  . Marital status: Married    Spouse name: Not on file  . Number of children: Not on file  . Years of education: Not on file  . Highest education level: Not on file  Occupational History  . Not on file  Tobacco Use  . Smoking status: Never Smoker  . Smokeless tobacco: Never Used  Vaping Use  . Vaping Use: Never used  Substance and Sexual Activity  . Alcohol use: Yes    Comment: occ.  . Drug use: No  . Sexual activity: Yes    Birth control/protection: Condom  Other Topics Concern  . Not on file  Social History Narrative  . Not on file   Social Determinants of Health   Financial Resource Strain:   . Difficulty of Paying Living Expenses: Not on file  Food Insecurity:   . Worried About Programme researcher, broadcasting/film/video in the Last Year:  Not on file  . Ran Out of Food in the Last Year: Not on file  Transportation Needs:   . Lack of Transportation (Medical): Not on file  . Lack of Transportation (Non-Medical): Not on file  Physical Activity:   . Days of Exercise per Week: Not on file  . Minutes of Exercise per Session: Not on file  Stress:   . Feeling of Stress : Not on file  Social Connections:   . Frequency of Communication with Friends and Family: Not on file  . Frequency of Social Gatherings with Friends and Family: Not on file  . Attends Religious Services: Not on file  . Active Member of Clubs or Organizations: Not on file  . Attends Banker Meetings: Not on file  . Marital  Status: Not on file  Intimate Partner Violence:   . Fear of Current or Ex-Partner: Not on file  . Emotionally Abused: Not on file  . Physically Abused: Not on file  . Sexually Abused: Not on file   Family History  Problem Relation Age of Onset  . Asthma Mother   . Coronary artery disease Paternal Grandfather   . Cancer Paternal Grandfather        bladder  . Cancer Paternal Grandmother        lung  . Coronary artery disease Paternal Grandmother   . Coronary artery disease Maternal Grandmother   . Coronary artery disease Maternal Grandfather   . Hypertension Father   . Hypertension Other   . Stroke Other   . Cancer Other        lung, cervical   . Coronary artery disease Other     OBJECTIVE:  Vitals:   05/28/20 0913  BP: 109/81  Pulse: 91  Resp: 20  Temp: 98.4 F (36.9 C)  SpO2: 98%     General appearance: alert; appears fatigued, but nontoxic; speaking in full sentences and tolerating own secretions HEENT: NCAT; Ears: EACs clear, TMs with bilateral middle ear effusion; eyes: PERRL.  EOM grossly intact. Sinuses: nontender; Nose: nares patent without rhinorrhea, Throat: oropharynx clear, tonsils non erythematous or enlarged, uvula midline  Neck: supple without LAD Lungs: unlabored respirations, symmetrical air entry; cough: moderate; no respiratory distress; CTAB Heart: regular rate and rhythm.  Radial pulses 2+ symmetrical bilaterally Skin: warm and dry Psychological: alert and cooperative; normal mood and affect  LABS:  No results found for this or any previous visit (from the past 24 hour(s)).   ASSESSMENT & PLAN:  1. Acute bronchitis, unspecified organism     Meds ordered this encounter  Medications  . benzonatate (TESSALON) 100 MG capsule    Sig: Take 1 capsule (100 mg total) by mouth every 8 (eight) hours.    Dispense:  30 capsule    Refill:  0  . azithromycin (ZITHROMAX) 250 MG tablet    Sig: Take 1 tablet (250 mg total) by mouth daily. Take first 2  tablets together, then 1 every day until finished.    Dispense:  6 tablet    Refill:  0  . dexamethasone (DECADRON) 4 MG tablet    Sig: Take 1 tablet (4 mg total) by mouth daily for 7 days.    Dispense:  7 tablet    Refill:  0    Discharge instructions  Get plenty of rest and push fluids Tessalon Perles prescribed for cough Azithromycin was prescribed Decadron was prescribed Use medications daily for symptom relief Use OTC medications like ibuprofen or tylenol as needed  fever or pain Call or go to the ED if you have any new or worsening symptoms such as fever, worsening cough, shortness of breath, chest tightness, chest pain, turning blue, changes in mental status, etc...   Reviewed expectations re: course of current medical issues. Questions answered. Outlined signs and symptoms indicating need for more acute intervention. Patient verbalized understanding. After Visit Summary given.         Durward Parcel, FNP 05/28/20 0936    Durward Parcel, FNP 05/28/20 [phone]

## 2020-05-28 NOTE — Discharge Instructions (Signed)
Get plenty of rest and push fluids Tessalon Perles prescribed for cough Azithromycin was prescribed  Decadron was prescribed Use medications daily for symptom relief Use OTC medications like ibuprofen or tylenol as needed fever or pain Call or go to the ED if you have any new or worsening symptoms such as fever, worsening cough, shortness of breath, chest tightness, chest pain, turning blue, changes in mental status, etc...  

## 2020-05-28 NOTE — ED Triage Notes (Signed)
Pt presents with scratchy throat and ear pain that first began on sunday, no feer, declines covid test

## 2020-06-04 ENCOUNTER — Emergency Department (HOSPITAL_COMMUNITY)
Admission: EM | Admit: 2020-06-04 | Discharge: 2020-06-04 | Disposition: A | Discharge status: Home / Self Care | Payer: BC Managed Care – PPO | Attending: Emergency Medicine | Admitting: Emergency Medicine

## 2020-06-04 ENCOUNTER — Ambulatory Visit
Admission: EM | Admit: 2020-06-04 | Discharge: 2020-06-04 | Disposition: A | Discharge status: Home / Self Care | Payer: BC Managed Care – PPO

## 2020-06-04 ENCOUNTER — Emergency Department (HOSPITAL_COMMUNITY): Payer: BC Managed Care – PPO

## 2020-06-04 ENCOUNTER — Other Ambulatory Visit: Payer: Self-pay

## 2020-06-04 ENCOUNTER — Encounter (HOSPITAL_COMMUNITY): Payer: Self-pay

## 2020-06-04 DIAGNOSIS — R0789 Other chest pain: Secondary | ICD-10-CM | POA: Diagnosis not present

## 2020-06-04 DIAGNOSIS — K219 Gastro-esophageal reflux disease without esophagitis: Secondary | ICD-10-CM

## 2020-06-04 DIAGNOSIS — Z79899 Other long term (current) drug therapy: Secondary | ICD-10-CM | POA: Insufficient documentation

## 2020-06-04 DIAGNOSIS — E039 Hypothyroidism, unspecified: Secondary | ICD-10-CM | POA: Diagnosis not present

## 2020-06-04 DIAGNOSIS — R079 Chest pain, unspecified: Secondary | ICD-10-CM | POA: Diagnosis not present

## 2020-06-04 LAB — BASIC METABOLIC PANEL
Anion gap: 13 (ref 5–15)
BUN: 12 mg/dL (ref 6–20)
CO2: 22 mmol/L (ref 22–32)
Calcium: 9.4 mg/dL (ref 8.9–10.3)
Chloride: 101 mmol/L (ref 98–111)
Creatinine, Ser: 0.71 mg/dL (ref 0.44–1.00)
GFR, Estimated: >60 mL/min (ref >60)
Glucose, Bld: 116 mg/dL — ABNORMAL HIGH (ref 70–99)
Potassium: 3.8 mmol/L (ref 3.5–5.1)
Sodium: 136 mmol/L (ref 135–145)

## 2020-06-04 LAB — HEPATIC FUNCTION PANEL
ALT: 15 U/L (ref 0–44)
AST: 15 U/L (ref 15–41)
Albumin: 4.3 g/dL (ref 3.5–5.0)
Alkaline Phosphatase: 41 U/L (ref 38–126)
Bilirubin, Direct: 0.1 mg/dL (ref 0.0–0.2)
Indirect Bilirubin: 0.5 mg/dL (ref 0.3–0.9)
Total Bilirubin: 0.6 mg/dL (ref 0.3–1.2)
Total Protein: 7.2 g/dL (ref 6.5–8.1)

## 2020-06-04 LAB — LIPASE, BLOOD: Lipase: 38 U/L (ref 11–51)

## 2020-06-04 LAB — CBC
HCT: 43.1 % (ref 36.0–46.0)
Hemoglobin: 14.3 g/dL (ref 12.0–15.0)
MCH: 29.3 pg (ref 26.0–34.0)
MCHC: 33.2 g/dL (ref 30.0–36.0)
MCV: 88.3 fL (ref 80.0–100.0)
Platelets: 378 10*3/uL (ref 150–400)
RBC: 4.88 MIL/uL (ref 3.87–5.11)
RDW: 13.8 % (ref 11.5–15.5)
WBC: 15.6 10*3/uL — ABNORMAL HIGH (ref 4.0–10.5)
nRBC: 0 % (ref 0.0–0.2)

## 2020-06-04 LAB — TROPONIN I (HIGH SENSITIVITY)
Troponin I (High Sensitivity): <2 ng/L (ref <18)
Troponin I (High Sensitivity): <2 ng/L (ref <18)

## 2020-06-04 LAB — POC URINE PREG, ED: Preg Test, Ur: NEGATIVE

## 2020-06-04 MED ORDER — LIDOCAINE VISCOUS HCL 2 % MT SOLN
15.0000 mL | Freq: Once | OROMUCOSAL | Status: AC
  Administered 2020-06-04: 15 mL via ORAL
  Filled 2020-06-04: qty 15

## 2020-06-04 MED ORDER — ALUM & MAG HYDROXIDE-SIMETH 200-200-20 MG/5ML PO SUSP
30.0000 mL | Freq: Once | ORAL | Status: AC
  Administered 2020-06-04: 30 mL via ORAL
  Filled 2020-06-04: qty 30

## 2020-06-04 NOTE — ED Provider Notes (Signed)
[facility] EMERGENCY DEPARTMENT Provider Note   CSN: [id number] Arrival date & time: 06/04/20  1659     History Chief Complaint  Patient presents with  . Chest Pain    Ashley Jarvis is a 49 y.o. female.  HPI 49 year old female with a history of hypothyroidism presents to the ER with complaints of 1 day of chest pressure and burning.  Patient states that she was recently diagnosed with bronchitis and states that she finished her antibiotics on Monday.  She woke up this morning with chest pressure and tightness unrelieved by Zantac.  She denies any diaphoresis, shortness of breath, back pain, dizziness, syncope.  States that she has only had gastric reflux once in her life and this feels slightly different.  Denies any lower extremity swelling, not on any supplemental estrogen, no recent surgeries or travel.  States she still has a lingering mildly productive cough.  Denies any fevers or chills.    Past Medical History:  Diagnosis Date  . Breast cyst   . History of abnormal cervical Pap smear 02/26/2014  . Hypothyroidism     Patient Active Problem List   Diagnosis Date Noted  . History of mastitis 08/30/2018  . Breast mass, right 08/30/2018  . Gastroesophageal reflux disease without esophagitis 07/18/2018  . Other chest pain 07/18/2018  . Encounter for gynecological examination with Papanicolaou smear of cervix 05/24/2017  . Pelvic pain 05/16/2017  . Bloating 05/16/2017  . History of abnormal cervical Pap smear 02/26/2014  . Breast lump 10/11/2013  . Sinus infection 10/11/2013  . Hypothyroidism 12/05/2012    Past Surgical History:  Procedure Laterality Date  . APPENDECTOMY    . ESOPHAGOGASTRODUODENOSCOPY N/A 10/18/2018   Procedure: ESOPHAGOGASTRODUODENOSCOPY (EGD);  Surgeon: Malissa Hippoehman, Najeeb U, MD;  Location: [facility] ENDO SUITE;  Service: Endoscopy;  Laterality: N/A;  2:55-rescheduled to 3/5 @ 2:00pm per Dewayne HatchAnn  . I & D EXTREMITY Right 01/24/2014   Procedure: IRRIGATION AND  DEBRIDEMENT RIGHT THUMB AND IP JOINT AND FLEXOR TENDON SHEATH;  Surgeon: Tami RibasKevin R Kuzma, MD;  Location: [facility] OR;  Service: Orthopedics;  Laterality: Right;     OB History    Gravida  3   Para  2   Term      Preterm      AB  1   Living  2     SAB  1   TAB      Ectopic      Multiple      Live Births  2           Family History  Problem Relation Age of Onset  . Asthma Mother   . Coronary artery disease Paternal Grandfather   . Cancer Paternal Grandfather        bladder  . Cancer Paternal Grandmother        lung  . Coronary artery disease Paternal Grandmother   . Coronary artery disease Maternal Grandmother   . Coronary artery disease Maternal Grandfather   . Hypertension Father   . Hypertension Other   . Stroke Other   . Cancer Other        lung, cervical   . Coronary artery disease Other     Social History   Tobacco Use  . Smoking status: Never Smoker  . Smokeless tobacco: Never Used  Vaping Use  . Vaping Use: Never used  Substance Use Topics  . Alcohol use: Yes    Comment: occ.  . Drug use: No    Home  Medications Prior to Admission medications   Medication Sig Start Date End Date Taking? Authorizing Provider  benzonatate (TESSALON) 100 MG capsule Take 1 capsule (100 mg total) by mouth every 8 (eight) hours. 05/28/20  Yes Avegno, Zachery Dakins, FNP  dexamethasone (DECADRON) 4 MG tablet Take 1 tablet (4 mg total) by mouth daily for 7 days. 05/28/20 06/04/20 Yes Avegno, Zachery Dakins, FNP  guaiFENesin (MUCINEX) 600 MG 12 hr tablet Take 600 mg by mouth daily.   Yes [provider]  Magnesium Oxide (MAG-CAPS PO) Take 1-2 tablets by mouth at bedtime.    Yes [provider]  Nutritional Supplements (JUICE PLUS FIBRE PO) Take 6 tablets by mouth daily. 2 capsules of Fruit-Berry-Vegetable   Yes [provider]  Omega-3 Fatty Acids (OMEGA-3 FISH OIL PO) Take 2 capsules by mouth daily. Plant based   Yes [provider]    Progesterone Micronized (PROGESTERONE PO) Take 1 capsule by mouth at bedtime.  06/03/18  Yes [provider]  thyroid (ARMOUR THYROID) 60 MG tablet Take 60 mg by mouth daily.   Yes [provider]  UNABLE TO FIND Testosterone-pellet every 4 months Bioidentical hormones   Yes [provider]  azithromycin (ZITHROMAX) 250 MG tablet Take 1 tablet (250 mg total) by mouth daily. Take first 2 tablets together, then 1 every day until finished. 05/28/20   Avegno, Zachery Dakins, FNP  naproxen sodium (ALEVE) 220 MG tablet Take 440 mg by mouth 2 (two) times daily as needed (headaches.).    [provider]  omeprazole (PRILOSEC) 20 MG capsule Take 1 capsule (20 mg total) by mouth daily as needed. 10/18/18   Malissa Hippo, MD    Allergies    Sulfa antibiotics  Review of Systems   Review of Systems  Constitutional: Negative for chills and fever.  HENT: Negative for ear pain and sore throat.   Eyes: Negative for pain and visual disturbance.  Respiratory: Positive for cough. Negative for shortness of breath.   Cardiovascular: Positive for chest pain. Negative for palpitations.  Gastrointestinal: Negative for abdominal pain and vomiting.  Genitourinary: Negative for dysuria and hematuria.  Musculoskeletal: Negative for arthralgias and back pain.  Skin: Negative for color change and rash.  Neurological: Negative for seizures and syncope.  All other systems reviewed and are negative.   Physical Exam Updated Vital Signs BP 124/80   Pulse 92   Temp 98.9 F (37.2 C) (Oral)   Resp 16   Ht 5\' 3"  (1.6 m)   Wt 67 kg   LMP 05/26/2020   SpO2 96%   BMI 26.18 kg/m   Physical Exam Vitals and nursing note reviewed.  Constitutional:      General: She is not in acute distress.    Appearance: She is well-developed. She is not ill-appearing, toxic-appearing or diaphoretic.  HENT:     Head: Normocephalic and atraumatic.  Eyes:     Conjunctiva/sclera: Conjunctivae  normal.  Cardiovascular:     Rate and Rhythm: Normal rate and regular rhythm.     Pulses:          Radial pulses are 2+ on the right side and 2+ on the left side.     Heart sounds: Normal heart sounds. No murmur heard.   Pulmonary:     Effort: Pulmonary effort is normal. No respiratory distress.     Breath sounds: Normal breath sounds. No decreased breath sounds, wheezing, rhonchi or rales.  Abdominal:     Palpations: Abdomen is soft.  Tenderness: There is no abdominal tenderness.  Musculoskeletal:     Cervical back: Neck supple.     Right lower leg: No tenderness. No edema.     Left lower leg: No tenderness. No edema.  Skin:    General: Skin is warm and dry.     Capillary Refill: Capillary refill takes less than 2 seconds.     Findings: No erythema.  Neurological:     General: No focal deficit present.     Mental Status: She is alert.  Psychiatric:        Mood and Affect: Mood normal.        Behavior: Behavior normal.     ED Results / Procedures / Treatments   Labs (all labs ordered are listed, but only abnormal results are displayed) Labs Reviewed  BASIC METABOLIC PANEL - Abnormal; Notable for the following components:      Result Value   Glucose, Bld 116 (*)    All other components within normal limits  CBC - Abnormal; Notable for the following components:   WBC 15.6 (*)    All other components within normal limits  LIPASE, BLOOD  HEPATIC FUNCTION PANEL  POC URINE PREG, ED  TROPONIN I (HIGH SENSITIVITY)  TROPONIN I (HIGH SENSITIVITY)    EKG EKG Interpretation  Date/Time:  Thursday June 04 2020 17:11:22 EDT Ventricular Rate:  101 PR Interval:  116 QRS Duration: 84 QT Interval:  330 QTC Calculation: 427 R Axis:   124 Text Interpretation: Sinus tachycardia Right axis deviation Nonspecific ST abnormality Abnormal ECG No old tracing to compare Confirmed by Linwood Dibbles [phone]) on 06/04/2020 5:57:18 PM   Radiology DG Chest 2 View  Result Date:  06/04/2020 CLINICAL DATA:  Chest pain EXAM: CHEST - 2 VIEW COMPARISON:  04/09/2009 FINDINGS: The heart size and mediastinal contours are within normal limits. Mild bronchitic changes. Both lungs are clear. The visualized skeletal structures are unremarkable. IMPRESSION: No active cardiopulmonary disease. Mild bronchitic changes. Electronically Signed   By: Jasmine Pang M.D.   On: 06/04/2020 17:44    Procedures Procedures (including critical care time)  Medications Ordered in ED Medications  alum & mag hydroxide-simeth (MAALOX/MYLANTA) 200-200-20 MG/5ML suspension 30 mL (30 mLs Oral Given 06/04/20 1913)    And  lidocaine (XYLOCAINE) 2 % viscous mouth solution 15 mL (15 mLs Oral Given 06/04/20 1912)    ED Course  I have reviewed the triage vital signs and the nursing notes.  Pertinent labs & imaging results that were available during my care of the patient were reviewed by me and considered in my medical decision making (see chart for details).    MDM Rules/Calculators/A&P                         49 year old female with complaints of chest pressure and tightness starting earlier today.  Vitals overall reassuring, not hypotensive, tachypneic tachycardic throughout ED course.  Physical exam is benign, abdomen is soft and nontender, lung sounds clear.  No reproducible chest wall tenderness.  Her CBC does have a leukocytosis of 15.6, however otherwise unremarkable.  BMP and liver function panels normal.  Lipase normal.  Troponin is less than 2.  EKG originally sinus tach, however pulse improved throughout the ED course.  Chest x-ray with mild bronchitic changes but no evidence of pneumonia, dissection, pneumothorax.  Concern for ACS, PE, dissection is low as the patient does not have any complaints of shortness of breath no lower extremity swelling,  no risk factors.  Leukocytosis likely in the setting of recent infection.  Patient was given GI cocktail and notes significant improvement in her  symptoms which is overall reassuring.  Suspect this is secondary to GERD given she has this on her problem list.  Patient was encouraged to take a PPI at home, return precautions discussed.  She is overall reassured by the work-up.  The patient's questions have been answered to her satisfaction, she voices understanding and is agreeable to this plan.  At this stage in the ED course, the patient is medically screened and stable for discharge.  Final Clinical Impression(s) / ED Diagnoses Final diagnoses:  Gastroesophageal reflux disease without esophagitis    Rx / DC Orders ED Discharge Orders    None       Leone Brand 06/04/20 2011    Bethann Berkshire, MD 06/05/20 [phone]

## 2020-06-04 NOTE — ED Triage Notes (Addendum)
Burning pain in center of chest that started last night, pt took some acid reflux medication at that time with no relief.   Pt also report tightness, pressure and congestion that has been ongoing since her last visit to urgent care.  She was dx with bronchitis last time she was here.

## 2020-06-04 NOTE — ED Notes (Signed)
Patient is being discharged from the Urgent Care and sent to the Emergency Department via pov . Per B Wurst, patient is in need of higher level of care due to chest pain. Patient is aware and verbalizes understanding of plan of care. There were no vitals filed for this visit.  

## 2020-06-04 NOTE — Discharge Instructions (Signed)
Your work-up today was overall reassuring.  I suspect that your symptoms are secondary to gastric reflux.  You may take an over-the-counter proton pump inhibitor such as Prilosec, Nexium, Protonix.  Please follow-up with your primary care doctor.  Return to the ER for any new or worsening symptoms.

## 2020-06-04 NOTE — ED Triage Notes (Signed)
Pt. States they are experiencing pressure, tightness and burning in their chest. Pt. States they were diagnosed with bronchitis last week. Pt. States they finished their antibiotics on Monday. Pt. States they woke up feeling the chest tightness and burning in their chest this morning. Pt. States the pressure mid chest and it feels like it's currently burning as well.

## 2020-06-16 DIAGNOSIS — E039 Hypothyroidism, unspecified: Secondary | ICD-10-CM | POA: Diagnosis not present

## 2020-06-16 DIAGNOSIS — R7982 Elevated C-reactive protein (CRP): Secondary | ICD-10-CM | POA: Diagnosis not present

## 2020-06-16 DIAGNOSIS — R5383 Other fatigue: Secondary | ICD-10-CM | POA: Diagnosis not present

## 2020-06-16 DIAGNOSIS — R5381 Other malaise: Secondary | ICD-10-CM | POA: Diagnosis not present

## 2020-06-16 DIAGNOSIS — N951 Menopausal and female climacteric states: Secondary | ICD-10-CM | POA: Diagnosis not present

## 2020-07-29 DIAGNOSIS — R7982 Elevated C-reactive protein (CRP): Secondary | ICD-10-CM | POA: Diagnosis not present

## 2020-07-29 DIAGNOSIS — E039 Hypothyroidism, unspecified: Secondary | ICD-10-CM | POA: Diagnosis not present

## 2020-07-29 DIAGNOSIS — R4189 Other symptoms and signs involving cognitive functions and awareness: Secondary | ICD-10-CM | POA: Diagnosis not present

## 2020-07-29 DIAGNOSIS — B2790 Infectious mononucleosis, unspecified without complication: Secondary | ICD-10-CM | POA: Diagnosis not present

## 2020-08-21 ENCOUNTER — Other Ambulatory Visit: Payer: Self-pay | Admitting: Adult Health

## 2020-08-21 DIAGNOSIS — Z1231 Encounter for screening mammogram for malignant neoplasm of breast: Secondary | ICD-10-CM

## 2020-09-30 ENCOUNTER — Other Ambulatory Visit: Payer: Self-pay

## 2020-09-30 ENCOUNTER — Ambulatory Visit
Admission: RE | Admit: 2020-09-30 | Discharge: 2020-09-30 | Disposition: A | Discharge status: Home / Self Care | Payer: BC Managed Care – PPO | Source: Ambulatory Visit | Attending: Adult Health | Admitting: Adult Health

## 2020-09-30 DIAGNOSIS — Z1231 Encounter for screening mammogram for malignant neoplasm of breast: Secondary | ICD-10-CM

## 2020-12-29 ENCOUNTER — Other Ambulatory Visit: Payer: BC Managed Care – PPO | Admitting: Adult Health

## 2021-03-02 ENCOUNTER — Other Ambulatory Visit: Payer: BC Managed Care – PPO | Admitting: Adult Health

## 2021-04-15 ENCOUNTER — Other Ambulatory Visit (HOSPITAL_COMMUNITY)
Admission: RE | Admit: 2021-04-15 | Discharge: 2021-04-15 | Disposition: A | Discharge status: Home / Self Care | Payer: BC Managed Care – PPO | Source: Ambulatory Visit | Attending: Adult Health | Admitting: Adult Health

## 2021-04-15 ENCOUNTER — Other Ambulatory Visit: Payer: Self-pay

## 2021-04-15 ENCOUNTER — Ambulatory Visit (INDEPENDENT_AMBULATORY_CARE_PROVIDER_SITE_OTHER): Payer: BC Managed Care – PPO | Admitting: Adult Health

## 2021-04-15 ENCOUNTER — Encounter: Payer: Self-pay | Admitting: Adult Health

## 2021-04-15 VITALS — BP 106/74 | HR 74 | Ht 63.0 in | Wt 151.0 lb

## 2021-04-15 DIAGNOSIS — Z01419 Encounter for gynecological examination (general) (routine) without abnormal findings: Secondary | ICD-10-CM | POA: Diagnosis not present

## 2021-04-15 DIAGNOSIS — Z1211 Encounter for screening for malignant neoplasm of colon: Secondary | ICD-10-CM | POA: Diagnosis not present

## 2021-04-15 LAB — HEMOCCULT GUIAC POC 1CARD (OFFICE): Fecal Occult Blood, POC: NEGATIVE

## 2021-04-15 NOTE — Progress Notes (Signed)
Patient ID: Ashley Jarvis, female   DOB: May 03, 1971, 50 y.o.   MRN: [id number] History of Present Illness: Ashley Jarvis is a 50 year old white female, married, G3P2 in for well woman gyn exam and pap. PCP is Dr Phillips Odor, sees MD in [place] for progesterone and testosterone   Current Medications, Allergies, Past Medical History, Past Surgical History, Family History and Social History were reviewed in [employer] electronic medical record.     Review of Systems: Patient denies any headaches, hearing loss, fatigue, blurred vision, shortness of breath, chest pain, abdominal pain, problems with bowel movements, urination, or intercourse. No joint pain or mood swings.  Periods regular Some hot flashes   Physical Exam:BP 106/74 (BP Location: Left Arm, Patient Position: Sitting, Cuff Size: Normal)   Pulse 74   Ht 5\' 3"  (1.6 m)   Wt 151 lb (68.5 kg)   LMP 03/31/2021 (Exact Date)   BMI 26.75 kg/m   General:  Well developed, well nourished, no acute distress Skin:  Warm and dry Neck:  Midline trachea, normal thyroid, good ROM, no lymphadenopathy Lungs; Clear to auscultation bilaterally Breast:  No dominant palpable mass, retraction, or nipple discharge Cardiovascular: Regular rate and rhythm Abdomen:  Soft, non tender, no hepatosplenomegaly Pelvic:  External genitalia is normal in appearance, no lesions.  The vagina is normal in appearance. Urethra has no lesions or masses. The cervix is smooth, pap with HR HPV genotyping performed.  Uterus is felt to be normal size, shape, and contour.  No adnexal masses or tenderness noted.Bladder is non tender, no masses felt. Rectal: Good sphincter tone, no polyps, or hemorrhoids felt.  Hemoccult negative. Extremities/musculoskeletal:  No swelling or varicosities noted, no clubbing or cyanosis Psych:  No mood changes, alert and cooperative,seems happy AA is 2 Fall risk is low Depression screen [facility] 2/9 04/15/2021 05/24/2017 05/16/2017  Decreased Interest  0 0 0  Down, Depressed, Hopeless 0 0 0  PHQ - 2 Score 0 0 0  Altered sleeping 0 - -  Tired, decreased energy 0 - -  Change in appetite 0 - -  Feeling bad or failure about yourself  0 - -  Trouble concentrating 0 - -  Moving slowly or fidgety/restless 0 - -  Suicidal thoughts 0 - -  PHQ-9 Score 0 - -    GAD 7 : Generalized Anxiety Score 04/15/2021  Nervous, Anxious, on Edge 0  Control/stop worrying 0  Worry too much - different things 0  Trouble relaxing 0  Restless 0  Easily annoyed or irritable 0  Afraid - awful might happen 0  Total GAD 7 Score 0      Upstream - 04/15/21 1127       Pregnancy Intention Screening   Does the patient want to become pregnant in the next year? No    Does the patient's partner want to become pregnant in the next year? No    Would the patient like to discuss contraceptive options today? No      Contraception Wrap Up   Current Method Female Condom    End Method Female Condom    Contraception Counseling Provided No            Examination chaperoned by 06/15/21 LPN   Impression and Plan: 1. Encounter for gynecological examination with Papanicolaou smear of cervix Pap sent Physical in 1 year Pap in 3 if normal Labs with MD in [place] Mammogram yearly  2. Encounter for screening fecal occult blood testing

## 2021-04-16 LAB — CYTOLOGY - PAP
Adequacy: ABSENT
Comment: NEGATIVE
Diagnosis: NEGATIVE
High risk HPV: NEGATIVE

## 2021-04-23 DIAGNOSIS — D225 Melanocytic nevi of trunk: Secondary | ICD-10-CM | POA: Diagnosis not present

## 2021-04-23 DIAGNOSIS — Z86018 Personal history of other benign neoplasm: Secondary | ICD-10-CM | POA: Diagnosis not present

## 2021-04-23 DIAGNOSIS — D2262 Melanocytic nevi of left upper limb, including shoulder: Secondary | ICD-10-CM | POA: Diagnosis not present

## 2021-04-23 DIAGNOSIS — L821 Other seborrheic keratosis: Secondary | ICD-10-CM | POA: Diagnosis not present

## 2021-05-31 DIAGNOSIS — B2790 Infectious mononucleosis, unspecified without complication: Secondary | ICD-10-CM | POA: Diagnosis not present

## 2021-05-31 DIAGNOSIS — E039 Hypothyroidism, unspecified: Secondary | ICD-10-CM | POA: Diagnosis not present

## 2021-05-31 DIAGNOSIS — E559 Vitamin D deficiency, unspecified: Secondary | ICD-10-CM | POA: Diagnosis not present

## 2021-05-31 DIAGNOSIS — R4189 Other symptoms and signs involving cognitive functions and awareness: Secondary | ICD-10-CM | POA: Diagnosis not present

## 2021-05-31 DIAGNOSIS — R7982 Elevated C-reactive protein (CRP): Secondary | ICD-10-CM | POA: Diagnosis not present

## 2021-05-31 DIAGNOSIS — A6920 Lyme disease, unspecified: Secondary | ICD-10-CM | POA: Diagnosis not present

## 2021-06-21 DIAGNOSIS — E039 Hypothyroidism, unspecified: Secondary | ICD-10-CM | POA: Diagnosis not present

## 2021-06-21 DIAGNOSIS — R7982 Elevated C-reactive protein (CRP): Secondary | ICD-10-CM | POA: Diagnosis not present

## 2021-06-21 DIAGNOSIS — B2790 Infectious mononucleosis, unspecified without complication: Secondary | ICD-10-CM | POA: Diagnosis not present

## 2021-06-21 DIAGNOSIS — R4189 Other symptoms and signs involving cognitive functions and awareness: Secondary | ICD-10-CM | POA: Diagnosis not present

## 2022-01-03 ENCOUNTER — Other Ambulatory Visit: Payer: Self-pay | Admitting: Adult Health

## 2022-01-03 ENCOUNTER — Ambulatory Visit
Admission: RE | Admit: 2022-01-03 | Discharge: 2022-01-03 | Disposition: A | Discharge status: Home / Self Care | Payer: BC Managed Care – PPO | Source: Ambulatory Visit | Attending: Adult Health | Admitting: Adult Health

## 2022-01-03 DIAGNOSIS — Z1231 Encounter for screening mammogram for malignant neoplasm of breast: Secondary | ICD-10-CM | POA: Diagnosis not present

## 2022-02-10 DIAGNOSIS — R5383 Other fatigue: Secondary | ICD-10-CM | POA: Diagnosis not present

## 2022-02-10 DIAGNOSIS — R5381 Other malaise: Secondary | ICD-10-CM | POA: Diagnosis not present

## 2022-02-10 DIAGNOSIS — B2790 Infectious mononucleosis, unspecified without complication: Secondary | ICD-10-CM | POA: Diagnosis not present

## 2022-02-10 DIAGNOSIS — H524 Presbyopia: Secondary | ICD-10-CM | POA: Diagnosis not present

## 2022-02-10 DIAGNOSIS — R4189 Other symptoms and signs involving cognitive functions and awareness: Secondary | ICD-10-CM | POA: Diagnosis not present

## 2022-02-10 DIAGNOSIS — R7982 Elevated C-reactive protein (CRP): Secondary | ICD-10-CM | POA: Diagnosis not present

## 2022-02-10 DIAGNOSIS — E039 Hypothyroidism, unspecified: Secondary | ICD-10-CM | POA: Diagnosis not present

## 2022-02-21 DIAGNOSIS — B2790 Infectious mononucleosis, unspecified without complication: Secondary | ICD-10-CM | POA: Diagnosis not present

## 2022-02-21 DIAGNOSIS — R7982 Elevated C-reactive protein (CRP): Secondary | ICD-10-CM | POA: Diagnosis not present

## 2022-02-21 DIAGNOSIS — R4189 Other symptoms and signs involving cognitive functions and awareness: Secondary | ICD-10-CM | POA: Diagnosis not present

## 2022-02-21 DIAGNOSIS — E039 Hypothyroidism, unspecified: Secondary | ICD-10-CM | POA: Diagnosis not present

## 2022-05-02 DIAGNOSIS — D2221 Melanocytic nevi of right ear and external auricular canal: Secondary | ICD-10-CM | POA: Diagnosis not present

## 2022-05-02 DIAGNOSIS — L814 Other melanin hyperpigmentation: Secondary | ICD-10-CM | POA: Diagnosis not present

## 2022-05-02 DIAGNOSIS — D2262 Melanocytic nevi of left upper limb, including shoulder: Secondary | ICD-10-CM | POA: Diagnosis not present

## 2022-05-02 DIAGNOSIS — L821 Other seborrheic keratosis: Secondary | ICD-10-CM | POA: Diagnosis not present

## 2022-06-26 ENCOUNTER — Encounter (INDEPENDENT_AMBULATORY_CARE_PROVIDER_SITE_OTHER): Payer: Self-pay | Admitting: Gastroenterology

## 2022-10-12 DIAGNOSIS — E039 Hypothyroidism, unspecified: Secondary | ICD-10-CM | POA: Diagnosis not present

## 2022-10-12 DIAGNOSIS — E349 Endocrine disorder, unspecified: Secondary | ICD-10-CM | POA: Diagnosis not present

## 2022-10-12 DIAGNOSIS — G4700 Insomnia, unspecified: Secondary | ICD-10-CM | POA: Diagnosis not present

## 2022-10-12 DIAGNOSIS — A6920 Lyme disease, unspecified: Secondary | ICD-10-CM | POA: Diagnosis not present

## 2022-10-12 DIAGNOSIS — B2790 Infectious mononucleosis, unspecified without complication: Secondary | ICD-10-CM | POA: Diagnosis not present

## 2022-10-12 DIAGNOSIS — R5381 Other malaise: Secondary | ICD-10-CM | POA: Diagnosis not present

## 2022-10-27 DIAGNOSIS — E039 Hypothyroidism, unspecified: Secondary | ICD-10-CM | POA: Diagnosis not present

## 2022-10-27 DIAGNOSIS — R4189 Other symptoms and signs involving cognitive functions and awareness: Secondary | ICD-10-CM | POA: Diagnosis not present

## 2022-10-27 DIAGNOSIS — R6882 Decreased libido: Secondary | ICD-10-CM | POA: Diagnosis not present

## 2022-10-27 DIAGNOSIS — B2790 Infectious mononucleosis, unspecified without complication: Secondary | ICD-10-CM | POA: Diagnosis not present

## 2023-01-11 DIAGNOSIS — R030 Elevated blood-pressure reading, without diagnosis of hypertension: Secondary | ICD-10-CM | POA: Diagnosis not present

## 2023-01-11 DIAGNOSIS — E663 Overweight: Secondary | ICD-10-CM | POA: Diagnosis not present

## 2023-01-11 DIAGNOSIS — L03221 Cellulitis of neck: Secondary | ICD-10-CM | POA: Diagnosis not present

## 2023-01-11 DIAGNOSIS — Z6827 Body mass index (BMI) 27.0-27.9, adult: Secondary | ICD-10-CM | POA: Diagnosis not present

## 2023-02-07 ENCOUNTER — Other Ambulatory Visit: Payer: Self-pay | Admitting: Adult Health

## 2023-02-07 DIAGNOSIS — Z1231 Encounter for screening mammogram for malignant neoplasm of breast: Secondary | ICD-10-CM

## 2023-02-08 ENCOUNTER — Ambulatory Visit
Admission: RE | Admit: 2023-02-08 | Discharge: 2023-02-08 | Disposition: A | Discharge status: Home / Self Care | Payer: BC Managed Care – PPO | Source: Ambulatory Visit | Attending: Adult Health | Admitting: Adult Health

## 2023-02-08 DIAGNOSIS — Z1231 Encounter for screening mammogram for malignant neoplasm of breast: Secondary | ICD-10-CM | POA: Diagnosis not present

## 2023-02-08 DIAGNOSIS — H3540 Unspecified peripheral retinal degeneration: Secondary | ICD-10-CM | POA: Diagnosis not present

## 2023-03-20 ENCOUNTER — Other Ambulatory Visit: Payer: Self-pay

## 2023-03-20 ENCOUNTER — Emergency Department (HOSPITAL_COMMUNITY)
Admission: EM | Admit: 2023-03-20 | Discharge: 2023-03-20 | Disposition: A | Discharge status: Home / Self Care | Payer: BC Managed Care – PPO | Attending: Emergency Medicine | Admitting: Emergency Medicine

## 2023-03-20 ENCOUNTER — Encounter (HOSPITAL_COMMUNITY): Payer: Self-pay

## 2023-03-20 ENCOUNTER — Emergency Department (HOSPITAL_COMMUNITY): Payer: BC Managed Care – PPO

## 2023-03-20 DIAGNOSIS — F419 Anxiety disorder, unspecified: Secondary | ICD-10-CM | POA: Insufficient documentation

## 2023-03-20 DIAGNOSIS — R072 Precordial pain: Secondary | ICD-10-CM | POA: Insufficient documentation

## 2023-03-20 DIAGNOSIS — R0789 Other chest pain: Secondary | ICD-10-CM | POA: Diagnosis not present

## 2023-03-20 DIAGNOSIS — R079 Chest pain, unspecified: Secondary | ICD-10-CM | POA: Diagnosis not present

## 2023-03-20 LAB — CBC
HCT: 44.2 % (ref 36.0–46.0)
Hemoglobin: 14.7 g/dL (ref 12.0–15.0)
MCH: 29.1 pg (ref 26.0–34.0)
MCHC: 33.3 g/dL (ref 30.0–36.0)
MCV: 87.5 fL (ref 80.0–100.0)
Platelets: 299 10*3/uL (ref 150–400)
RBC: 5.05 MIL/uL (ref 3.87–5.11)
RDW: 14 % (ref 11.5–15.5)
WBC: 9.5 10*3/uL (ref 4.0–10.5)
nRBC: 0 % (ref 0.0–0.2)

## 2023-03-20 LAB — BASIC METABOLIC PANEL
Anion gap: 7 (ref 5–15)
BUN: 11 mg/dL (ref 6–20)
CO2: 21 mmol/L — ABNORMAL LOW (ref 22–32)
Calcium: 8.6 mg/dL — ABNORMAL LOW (ref 8.9–10.3)
Chloride: 107 mmol/L (ref 98–111)
Creatinine, Ser: 0.71 mg/dL (ref 0.44–1.00)
GFR, Estimated: >60 mL/min (ref >60)
Glucose, Bld: 127 mg/dL — ABNORMAL HIGH (ref 70–99)
Potassium: 3.5 mmol/L (ref 3.5–5.1)
Sodium: 135 mmol/L (ref 135–145)

## 2023-03-20 LAB — TROPONIN I (HIGH SENSITIVITY): Troponin I (High Sensitivity): <2 ng/L (ref <18)

## 2023-03-20 LAB — D-DIMER, QUANTITATIVE: D-Dimer, Quant: <0.27 ug/mL-FEU (ref 0.00–0.50)

## 2023-03-20 LAB — LIPASE, BLOOD: Lipase: 36 U/L (ref 11–51)

## 2023-03-20 MED ORDER — LORAZEPAM 1 MG PO TABS
1.0000 mg | ORAL_TABLET | Freq: Once | ORAL | Status: AC
  Administered 2023-03-20: 1 mg via ORAL
  Filled 2023-03-20: qty 1

## 2023-03-20 MED ORDER — LORAZEPAM 1 MG PO TABS: 1.0000 mg | ORAL_TABLET | Freq: Three times a day (TID) | ORAL | 0 refills | Status: AC | PRN

## 2023-03-20 NOTE — ED Notes (Signed)
Pt states she been under a lot of stress lately.Marland Kitchen daughter going off to college, etc. Pt became teary when speaking of it.

## 2023-03-20 NOTE — ED Triage Notes (Signed)
Pt woke up a little after midnight with pressure in upper gastric, mid chest. States its constant. Pt states nausea and some diarrhea. VS WNL in ED

## 2023-03-20 NOTE — Discharge Instructions (Signed)
You were seen for your chest discomfort in the emergency department.   At home, please take the Ativan in case it is related to anxiety.    Check your MyChart online for the results of any tests that had not resulted by the time you left the emergency department.   Follow-up with your primary doctor in 2-3 days regarding your visit.    Return immediately to the emergency department if you experience any of the following: Worsening chest pain, shortness of breath, or any other concerning symptoms.    Thank you for visiting our Emergency Department. It was a pleasure taking care of you today.

## 2023-03-20 NOTE — ED Provider Notes (Signed)
[place] EMERGENCY DEPARTMENT AT [facility] Provider Note   CSN: [id number] Arrival date & time: 03/20/23  7829     History  Chief Complaint  Patient presents with   Chest Pain    Ashley Jarvis is a 52 y.o. female.  52 year old female with a history of progesterone use who presents emergency department chest pain.  Patient reports that last night around 10 or 11 PM she started experiencing substernal chest discomfort.  Has had this intermittently and is associated with stress.  Says that they are moving her daughter and has been under more stress than usual recently.  Describes it as a pressure-like sensation that is substernal.  Not exertional or pleuritic.  No shortness of breath or cough.  No diaphoresis or vomiting but does have some mild nausea.  No lower extremity swelling.  No foul taste in her mouth or nocturnal cough.  No surgeries in the past year.  Mother died at a young age from either heart attack or a blood clot.  The patient is currently on hormone therapy.  No personal history of MI, DVT or PE, or cancer. Does not smoke.        Home Medications Prior to Admission medications   Medication Sig Start Date End Date Taking? Authorizing Provider  LORazepam (ATIVAN) 1 MG tablet Take 1 tablet (1 mg total) by mouth 3 (three) times daily as needed for anxiety. 03/20/23  Yes Rondel Baton, MD  Magnesium Oxide (MAG-CAPS PO) Take 1-2 tablets by mouth at bedtime.     [provider]  naproxen sodium (ALEVE) 220 MG tablet Take 440 mg by mouth 2 (two) times daily as needed (headaches.).    [provider]  Nutritional Supplements (JUICE PLUS FIBRE PO) Take 6 tablets by mouth daily. 2 capsules of Fruit-Berry-Vegetable    [provider]  Omega-3 Fatty Acids (OMEGA-3 FISH OIL PO) Take 2 capsules by mouth daily. Plant based    [provider]  Progesterone Micronized (PROGESTERONE PO) Take 1 capsule by mouth at bedtime.  06/03/18    [provider]  thyroid (ARMOUR) 60 MG tablet Take 60 mg by mouth daily.    [provider]  UNABLE TO FIND Testosterone-pellet every 4 months Bioidentical hormones    [provider]      Allergies    Sulfa antibiotics    Review of Systems   Review of Systems  Physical Exam Updated Vital Signs BP 114/79 (BP Location: Right Arm)   Pulse 75   Temp 98.3 F (36.8 C) (Oral)   Resp 12   Ht 5' 3.5" (1.613 m)   Wt 68 kg   LMP 03/11/2023   SpO2 96%   BMI 26.15 kg/m  Physical Exam Vitals and nursing note reviewed.  Constitutional:      General: She is not in acute distress.    Appearance: She is well-developed.  HENT:     Head: Normocephalic and atraumatic.     Right Ear: External ear normal.     Left Ear: External ear normal.     Nose: Nose normal.  Eyes:     Extraocular Movements: Extraocular movements intact.     Conjunctiva/sclera: Conjunctivae normal.     Pupils: Pupils are equal, round, and reactive to light.  Cardiovascular:     Rate and Rhythm: Normal rate and regular rhythm.     Heart sounds: No murmur heard. Pulmonary:     Effort: Pulmonary effort is normal. No respiratory  distress.     Breath sounds: Normal breath sounds.  Abdominal:     General: Abdomen is flat. There is no distension.     Palpations: Abdomen is soft. There is no mass.     Tenderness: There is no abdominal tenderness. There is no guarding.  Musculoskeletal:     Cervical back: Normal range of motion and neck supple.     Right lower leg: No edema.     Left lower leg: No edema.  Skin:    General: Skin is warm and dry.  Neurological:     Mental Status: She is alert and oriented to person, place, and time. Mental status is at baseline.  Psychiatric:        Mood and Affect: Mood normal.     ED Results / Procedures / Treatments   Labs (all labs ordered are listed, but only abnormal results are displayed) Labs Reviewed  BASIC METABOLIC PANEL - Abnormal; Notable  for the following components:      Result Value   CO2 21 (*)    Glucose, Bld 127 (*)    Calcium 8.6 (*)    All other components within normal limits  CBC  LIPASE, BLOOD  D-DIMER, QUANTITATIVE (NOT AT [facility])  TROPONIN I (HIGH SENSITIVITY)    EKG EKG Interpretation Date/Time:  Monday March 20 2023 07:27:07 EDT Ventricular Rate:  87 PR Interval:  130 QRS Duration:  96 QT Interval:  382 QTC Calculation: 460 R Axis:   0  Text Interpretation: Sinus rhythm Low voltage, precordial leads Incomplete right bundle branch block Confirmed by Vonita Moss [phone]) on 03/20/2023 8:42:47 AM  Radiology DG Chest 2 View  Result Date: 03/20/2023 CLINICAL DATA:  chest pain EXAM: CHEST - 2 VIEW COMPARISON:  06/04/2020 FINDINGS: Lungs are clear.  No pneumothorax Heart size and mediastinal contours are within normal limits. No effusion. Visualized bones unremarkable. IMPRESSION: No acute cardiopulmonary disease. Electronically Signed   By: Corlis Leak M.D.   On: 03/20/2023 08:09    Procedures Procedures    Medications Ordered in ED Medications  LORazepam (ATIVAN) tablet 1 mg (1 mg Oral Given 03/20/23 3086)    ED Course/ Medical Decision Making/ A&P                                 Medical Decision Making Amount and/or Complexity of Data Reviewed Labs: ordered. Radiology: ordered.  Risk Prescription drug management.   Ashley Jarvis is a 52 y.o. female with comorbidities that complicate the patient evaluation including progesterone use who presents emergency department with chest discomfort  Initial Ddx:  MI, PE, pneumonia, anxiety, reflux  MDM/Course:  Patient presents emergency department with substernal chest discomfort.  This appears to be exacerbated by stressors in her life recently and she thinks it may be related to her anxiety.  Is tachycardic in triage and is also on progesterone therapy so D-dimer was sent which was WNL.  Her EKG and troponin did not show any signs of  ischemia.  Given the duration of her symptoms do not feel that repeat troponin is necessary at this time.  She was given Ativan and upon re-evaluation reports that her symptoms have much improved.  Feel that her symptoms may be related to anxiety and so she was given a short course of Ativan and instructed to follow-up with her primary doctor in several days.  This patient presents to the ED for concern  of complaints listed in HPI, this involves an extensive number of treatment options, and is a complaint that carries with it a high risk of complications and morbidity. Disposition including potential need for admission considered.   Dispo: DC Home. Return precautions discussed including, but not limited to, those listed in the AVS. Allowed pt time to ask questions which were answered fully prior to dc.  Additional history obtained from spouse Records reviewed ED Visit Notes The following labs were independently interpreted: Chemistry and show no acute abnormality I independently reviewed the following imaging with scope of interpretation limited to determining acute life threatening conditions related to emergency care: Chest x-ray and agree with the radiologist interpretation with the following exceptions: none I personally reviewed and interpreted cardiac monitoring: normal sinus rhythm  I personally reviewed and interpreted the pt's EKG: see above for interpretation  I have reviewed the patients home medications and made adjustments as needed       Final Clinical Impression(s) / ED Diagnoses Final diagnoses:  Chest pain, unspecified type  Anxiety    Rx / DC Orders ED Discharge Orders          Ordered    LORazepam (ATIVAN) 1 MG tablet  3 times daily PRN        03/20/23 0859              Rondel Baton, MD 03/20/23 [phone]

## 2023-04-03 ENCOUNTER — Encounter (INDEPENDENT_AMBULATORY_CARE_PROVIDER_SITE_OTHER): Payer: Self-pay | Admitting: *Deleted

## 2023-04-03 ENCOUNTER — Ambulatory Visit (INDEPENDENT_AMBULATORY_CARE_PROVIDER_SITE_OTHER): Payer: BC Managed Care – PPO | Admitting: Adult Health

## 2023-04-03 ENCOUNTER — Encounter: Payer: Self-pay | Admitting: Adult Health

## 2023-04-03 VITALS — BP 112/73 | HR 77 | Ht 63.5 in | Wt 159.0 lb

## 2023-04-03 DIAGNOSIS — Z1211 Encounter for screening for malignant neoplasm of colon: Secondary | ICD-10-CM | POA: Insufficient documentation

## 2023-04-03 DIAGNOSIS — Z1330 Encounter for screening examination for mental health and behavioral disorders, unspecified: Secondary | ICD-10-CM | POA: Diagnosis not present

## 2023-04-03 DIAGNOSIS — Z01419 Encounter for gynecological examination (general) (routine) without abnormal findings: Secondary | ICD-10-CM

## 2023-04-03 LAB — HEMOCCULT GUIAC POC 1CARD (OFFICE): Fecal Occult Blood, POC: NEGATIVE

## 2023-04-03 NOTE — Progress Notes (Signed)
Patient ID: Ashley Jarvis, female   DOB: October 22, 1970, 52 y.o.   MRN: [id number] History of Present Illness: Ashley Jarvis is a 52 year old white female, married, [id number], in for a well woman gyn exam. She is still having periods, has missed one this year.Periods are shorter 2-3 days now.      Component Value Date/Time   DIAGPAP  04/15/2021 1130    - Negative for intraepithelial lesion or malignancy (NILM)   DIAGPAP  05/24/2017 0000    NEGATIVE FOR INTRAEPITHELIAL LESIONS OR MALIGNANCY.   HPVHIGH Negative 04/15/2021 1130   ADEQPAP  04/15/2021 1130    Satisfactory for evaluation; transformation zone component ABSENT.   ADEQPAP  05/24/2017 0000    Satisfactory for evaluation  endocervical/transformation zone component PRESENT.    PCP is Ashley Jarvis.   Current Medications, Allergies, Past Medical History, Past Surgical History, Family History and Social History were reviewed in [employer] record.     Review of Systems: Patient denies any headaches, hearing loss, fatigue, blurred vision, shortness of breath, chest pain, abdominal pain, problems with bowel movements, urination, or intercourse. No joint pain or Jarvis swings.  Has missed 1 period, periods shorter, last 2-3 days  Has gained weight    Physical Exam:BP 112/73 (BP Location: Left Arm, Patient Position: Sitting, Cuff Size: Normal)   Pulse 77   Ht 5' 3.5" (1.613 m)   Wt 159 lb (72.1 kg)   LMP 03/12/2023   BMI 27.72 kg/m   General:  Well developed, well nourished, no acute distress Skin:  Warm and dry Neck:  Midline trachea, normal thyroid, good ROM, no lymphadenopathy Lungs; Clear to auscultation bilaterally Breast:  No dominant palpable mass, retraction, or nipple discharge Cardiovascular: Regular rate and rhythm Abdomen:  Soft, non tender, no hepatosplenomegaly Pelvic:  External genitalia is normal in appearance, no lesions.  The vagina is normal in appearance. Urethra has no lesions or masses. The  cervix is bulbous.  Uterus is felt to be normal size, shape, and contour.  No adnexal masses or tenderness noted.Bladder is non tender, no masses felt. Rectal: Good sphincter tone, no polyps, or hemorrhoids felt.  Hemoccult negative. Extremities/musculoskeletal:  No swelling or varicosities noted, no clubbing or cyanosis Psych:  No Jarvis changes, alert and cooperative,seems happy Ashley Jarvis is 2 Fall risk is low    04/03/2023    1:34 PM 04/15/2021   11:36 AM 05/24/2017    2:08 PM  Depression screen PHQ 2/9  Decreased Interest 0 0 0  Down, Depressed, Hopeless 0 0 0  PHQ - 2 Score 0 0 0  Altered sleeping 0 0   Tired, decreased energy 0 0   Change in appetite 0 0   Feeling bad or failure about yourself  0 0   Trouble concentrating 0 0   Moving slowly or fidgety/restless 0 0   Suicidal thoughts 0 0   PHQ-9 Score 0 0        04/03/2023    1:34 PM 04/15/2021   11:37 AM  GAD 7 : Generalized Anxiety Score  Nervous, Anxious, on Edge 0 0  Control/stop worrying 0 0  Worry too much - different things 0 0  Trouble relaxing 0 0  Restless 0 0  Easily annoyed or irritable 0 0  Afraid - awful might happen 0 0  Total GAD 7 Score 0 0      Upstream - 04/03/23 1330       Pregnancy Intention Screening   Does the  patient want to become pregnant in the next year? No    Does the patient's partner want to become pregnant in the next year? No    Would the patient like to discuss contraceptive options today? No      Contraception Wrap Up   Current Method Female Condom    End Method Female Condom    Contraception Counseling Provided Yes             Examination chaperoned by Ashley Mood LPN  Impression and Plan: 1. Encounter for well woman exam with routine gynecological exam Pap and physical in 1 year Labs with Ashley Jarvis Mammogram was negative 02/08/23  2. Encounter for screening fecal occult blood testing Hemoccult was negative  - POCT occult blood stool  3. Screening for colorectal  cancer Referred to Ashley Jarvis for colonoscopy  - Ambulatory referral to Gastroenterology

## 2023-05-16 DIAGNOSIS — D2262 Melanocytic nevi of left upper limb, including shoulder: Secondary | ICD-10-CM | POA: Diagnosis not present

## 2023-05-16 DIAGNOSIS — L821 Other seborrheic keratosis: Secondary | ICD-10-CM | POA: Diagnosis not present

## 2023-05-16 DIAGNOSIS — D225 Melanocytic nevi of trunk: Secondary | ICD-10-CM | POA: Diagnosis not present

## 2023-05-16 DIAGNOSIS — Z86018 Personal history of other benign neoplasm: Secondary | ICD-10-CM | POA: Diagnosis not present

## 2023-07-03 DIAGNOSIS — R4189 Other symptoms and signs involving cognitive functions and awareness: Secondary | ICD-10-CM | POA: Diagnosis not present

## 2023-07-03 DIAGNOSIS — E039 Hypothyroidism, unspecified: Secondary | ICD-10-CM | POA: Diagnosis not present

## 2023-07-03 DIAGNOSIS — R5381 Other malaise: Secondary | ICD-10-CM | POA: Diagnosis not present

## 2023-07-03 DIAGNOSIS — B2790 Infectious mononucleosis, unspecified without complication: Secondary | ICD-10-CM | POA: Diagnosis not present

## 2023-07-03 DIAGNOSIS — R6882 Decreased libido: Secondary | ICD-10-CM | POA: Diagnosis not present

## 2023-07-03 DIAGNOSIS — R635 Abnormal weight gain: Secondary | ICD-10-CM | POA: Diagnosis not present

## 2023-07-31 DIAGNOSIS — R4189 Other symptoms and signs involving cognitive functions and awareness: Secondary | ICD-10-CM | POA: Diagnosis not present

## 2023-07-31 DIAGNOSIS — G4700 Insomnia, unspecified: Secondary | ICD-10-CM | POA: Diagnosis not present

## 2023-07-31 DIAGNOSIS — E039 Hypothyroidism, unspecified: Secondary | ICD-10-CM | POA: Diagnosis not present

## 2023-07-31 DIAGNOSIS — F419 Anxiety disorder, unspecified: Secondary | ICD-10-CM | POA: Diagnosis not present

## 2023-08-21 DIAGNOSIS — G4489 Other headache syndrome: Secondary | ICD-10-CM | POA: Diagnosis not present

## 2023-08-24 ENCOUNTER — Encounter (INDEPENDENT_AMBULATORY_CARE_PROVIDER_SITE_OTHER): Payer: Self-pay | Admitting: *Deleted

## 2023-10-04 ENCOUNTER — Encounter (INDEPENDENT_AMBULATORY_CARE_PROVIDER_SITE_OTHER): Payer: Self-pay | Admitting: *Deleted

## 2023-10-10 DIAGNOSIS — L570 Actinic keratosis: Secondary | ICD-10-CM | POA: Diagnosis not present

## 2023-10-19 DIAGNOSIS — G43009 Migraine without aura, not intractable, without status migrainosus: Secondary | ICD-10-CM | POA: Diagnosis not present

## 2023-11-01 ENCOUNTER — Telehealth (INDEPENDENT_AMBULATORY_CARE_PROVIDER_SITE_OTHER): Payer: Self-pay | Admitting: Gastroenterology

## 2023-11-01 DIAGNOSIS — Z1211 Encounter for screening for malignant neoplasm of colon: Secondary | ICD-10-CM

## 2023-11-01 NOTE — Telephone Encounter (Signed)
 Ok to schedule.  Room 1/2  Thanks,  Vista Lawman, MD Gastroenterology and Hepatology [facility]enter[facility]

## 2023-11-01 NOTE — Telephone Encounter (Signed)
 Who is your primary care physician: [facility]  Reasons for the colonoscopy: screening  Have you had a colonoscopy before?  no  Do you have family history of colon cancer? no  Previous colonoscopy with polyps removed? no  Do you have a history colorectal cancer?   no  Are you diabetic? If yes, Type 1 or Type 2?    no  Do you have a prosthetic or mechanical heart valve? no  Do you have a pacemaker/defibrillator?   no  Have you had endocarditis/atrial fibrillation? no  Have you had joint replacement within the last 12 months?  no  Do you tend to be constipated or have to use laxatives? no  Do you have any history of drugs or alchohol?  no  Do you use supplemental oxygen?  no  Have you had a stroke or heart attack within the last 6 months? no  Do you take weight loss medication?  no  For female patients: have you had a hysterectomy?  no                                     are you post menopausal?       no                                            do you still have your menstrual cycle? yes      Do you take any blood-thinning medications such as: (aspirin, warfarin, Plavix, Aggrenox)  no  If yes we need the name, milligram, dosage and who is prescribing doctor \\F  Current Outpatient Medications on File Prior to Visit  Medication Sig Dispense Refill   LORazepam (ATIVAN) 1 MG tablet Take 1 tablet (1 mg total) by mouth 3 (three) times daily as needed for anxiety. (Patient not taking: Reported on 11/01/2023) 15 tablet 0   Magnesium Oxide (MAG-CAPS PO) Take 1-2 tablets by mouth at bedtime.      naproxen sodium (ALEVE) 220 MG tablet Take 440 mg by mouth 2 (two) times daily as needed (headaches.). (Patient not taking: Reported on 11/01/2023)     Nutritional Supplements (JUICE PLUS FIBRE PO) Take 6 tablets by mouth daily. 2 capsules of Fruit-Berry-Vegetable     Omega-3 Fatty Acids (OMEGA-3 FISH OIL PO) Take 2 capsules by mouth daily. Plant based     Progesterone Micronized  (PROGESTERONE PO) Take 1 capsule by mouth at bedtime.   2   thyroid (ARMOUR) 60 MG tablet Take 60 mg by mouth daily. (Patient not taking: Reported on 11/01/2023)     UNABLE TO FIND Testosterone-pellet every 4 months Bioidentical hormones     No current facility-administered medications on file prior to visit.    Allergies  Allergen Reactions   Sulfa Antibiotics Swelling    Eyes swelled     Pharmacy: [facility]  Primary Insurance Name: BCBS  Best number where you can be reached: [phone]

## 2023-11-02 MED ORDER — PEG 3350-KCL-NA BICARB-NACL 420 G PO SOLR: 4000.0000 mL | Freq: Once | ORAL | 0 refills | Status: AC

## 2023-11-02 NOTE — Addendum Note (Signed)
 Addended by: Marlowe Shores on: 11/02/2023 11:12 AM   Modules accepted: Orders

## 2023-11-02 NOTE — Telephone Encounter (Signed)
 Left message to return call

## 2023-11-02 NOTE — Telephone Encounter (Signed)
 Pt left voicemail returning call. Returned call to pt. Pt scheduled. (Pt wanted a later time so she does not have to get up so early to finish her prep). Instructions will be mailed. Prep sent to pharmacy. No PA needed

## 2023-11-06 NOTE — Telephone Encounter (Signed)
.  referral was closed

## 2023-12-07 ENCOUNTER — Other Ambulatory Visit (HOSPITAL_COMMUNITY)
Admission: RE | Admit: 2023-12-07 | Discharge: 2023-12-07 | Disposition: A | Discharge status: Home / Self Care | Source: Ambulatory Visit | Attending: Gastroenterology | Admitting: Gastroenterology

## 2023-12-07 DIAGNOSIS — Z1211 Encounter for screening for malignant neoplasm of colon: Secondary | ICD-10-CM | POA: Diagnosis not present

## 2023-12-07 LAB — PREGNANCY, URINE: Preg Test, Ur: NEGATIVE

## 2023-12-11 ENCOUNTER — Encounter (HOSPITAL_COMMUNITY): Payer: Self-pay | Admitting: Gastroenterology

## 2023-12-11 ENCOUNTER — Ambulatory Visit (HOSPITAL_COMMUNITY)
Admission: RE | Admit: 2023-12-11 | Discharge: 2023-12-11 | Disposition: A | Discharge status: Home / Self Care | Attending: Gastroenterology | Admitting: Gastroenterology

## 2023-12-11 ENCOUNTER — Other Ambulatory Visit: Payer: Self-pay

## 2023-12-11 ENCOUNTER — Ambulatory Visit (HOSPITAL_COMMUNITY): Admitting: Certified Registered"

## 2023-12-11 ENCOUNTER — Encounter (HOSPITAL_COMMUNITY)
Admission: RE | Disposition: A | Discharge status: Home / Self Care | Payer: Self-pay | Source: Home / Self Care | Attending: Gastroenterology

## 2023-12-11 DIAGNOSIS — K635 Polyp of colon: Secondary | ICD-10-CM

## 2023-12-11 DIAGNOSIS — Z1211 Encounter for screening for malignant neoplasm of colon: Secondary | ICD-10-CM | POA: Insufficient documentation

## 2023-12-11 DIAGNOSIS — E039 Hypothyroidism, unspecified: Secondary | ICD-10-CM | POA: Insufficient documentation

## 2023-12-11 DIAGNOSIS — K648 Other hemorrhoids: Secondary | ICD-10-CM | POA: Insufficient documentation

## 2023-12-11 DIAGNOSIS — D120 Benign neoplasm of cecum: Secondary | ICD-10-CM | POA: Insufficient documentation

## 2023-12-11 DIAGNOSIS — Z139 Encounter for screening, unspecified: Secondary | ICD-10-CM | POA: Diagnosis not present

## 2023-12-11 HISTORY — PX: COLONOSCOPY: SHX5424

## 2023-12-11 SURGERY — COLONOSCOPY
Anesthesia: General

## 2023-12-11 MED ORDER — PROPOFOL 500 MG/50ML IV EMUL
INTRAVENOUS | Status: DC | PRN
  Administered 2023-12-11: 150 ug/kg/min via INTRAVENOUS

## 2023-12-11 MED ORDER — DEXMEDETOMIDINE HCL IN NACL 80 MCG/20ML IV SOLN
INTRAVENOUS | Status: DC | PRN
  Administered 2023-12-11: 8 ug via INTRAVENOUS
  Administered 2023-12-11: 4 ug via INTRAVENOUS
  Administered 2023-12-11: 8 ug via INTRAVENOUS

## 2023-12-11 MED ORDER — SODIUM CHLORIDE 0.9% FLUSH: 3.0000 mL | Freq: Two times a day (BID) | INTRAVENOUS | Status: DC

## 2023-12-11 MED ORDER — LIDOCAINE 2% (20 MG/ML) 5 ML SYRINGE
INTRAMUSCULAR | Status: DC | PRN
  Administered 2023-12-11: 100 mg via INTRAVENOUS

## 2023-12-11 MED ORDER — SODIUM CHLORIDE 0.9% FLUSH: 3.0000 mL | INTRAVENOUS | Status: DC | PRN

## 2023-12-11 MED ORDER — LACTATED RINGERS IV SOLN: INTRAVENOUS | Status: DC | PRN

## 2023-12-11 MED ORDER — PROPOFOL 10 MG/ML IV BOLUS
INTRAVENOUS | Status: DC | PRN
  Administered 2023-12-11 (×2): 50 mg via INTRAVENOUS
  Administered 2023-12-11: 100 mg via INTRAVENOUS

## 2023-12-11 NOTE — Transfer of Care (Signed)
 Immediate Anesthesia Transfer of Care Note  Patient: Ashley Jarvis  Procedure(s) Performed: COLONOSCOPY  Patient Location: Endoscopy Unit  Anesthesia Type:General  Level of Consciousness: drowsy  Airway & Oxygen Therapy: Patient Spontanous Breathing  Post-op Assessment: Report given to RN and Post -op Vital signs reviewed and stable  Post vital signs: Reviewed and stable  Last Vitals:  Vitals Value Taken Time  BP    Temp    Pulse    Resp    SpO2      Last Pain:  Vitals:   12/11/23 0728  TempSrc:   PainSc: 0-No pain      Patients Stated Pain Goal: 8 (12/11/23 0702)  Complications: No notable events documented.

## 2023-12-11 NOTE — H&P (Signed)
 Primary Care Physician:  Minus Amel, MD Primary Gastroenterologist:  Dr. Alita Irwin  Pre-Procedure History & Physical: HPI:  Ashley Jarvis is a 53 y.o. female is here for a colonoscopy for colon cancer screening purposes.  Patient denies any family history of colorectal cancer.  No melena or hematochezia.  No abdominal pain or unintentional weight loss.  No change in bowel habits.  Overall feels well from a GI standpoint.  Past Medical History:  Diagnosis Date   Breast cyst    History of abnormal cervical Pap smear 02/26/2014   Hypothyroidism     Past Surgical History:  Procedure Laterality Date   APPENDECTOMY     ESOPHAGOGASTRODUODENOSCOPY N/A 10/18/2018   Procedure: ESOPHAGOGASTRODUODENOSCOPY (EGD);  Surgeon: Ruby Corporal, MD;  Location: [facility];  Service: Endoscopy;  Laterality: N/A;  2:55-rescheduled to 3/5 @ 2:00pm per Lorelee Roger   I & D EXTREMITY Right 01/24/2014   Procedure: IRRIGATION AND DEBRIDEMENT RIGHT THUMB AND IP JOINT AND FLEXOR TENDON SHEATH;  Surgeon: Milagros Alf, MD;  Location: [facility] OR;  Service: Orthopedics;  Laterality: Right;    Prior to Admission medications   Medication Sig Start Date End Date Taking? Authorizing Provider  Magnesium Oxide (MAG-CAPS PO) Take 1-2 tablets by mouth at bedtime.    Yes [provider]  Nutritional Supplements (JUICE PLUS FIBRE PO) Take 6 tablets by mouth daily. 2 capsules of Fruit-Berry-Vegetable   Yes [provider]  Omega-3 Fatty Acids (OMEGA-3 FISH OIL PO) Take 2 capsules by mouth daily. Plant based   Yes [provider]  Progesterone  Micronized (PROGESTERONE  PO) Take 1 capsule by mouth at bedtime.  06/03/18  Yes [provider]  thyroid  (ARMOUR) 60 MG tablet Take 60 mg by mouth daily.   Yes [provider]  LORazepam  (ATIVAN ) 1 MG tablet Take 1 tablet (1 mg total) by mouth 3 (three) times daily as needed for anxiety. Patient not taking: Reported on 11/01/2023 03/20/23   Ninetta Basket, MD  naproxen sodium (ALEVE) 220 MG tablet Take 440 mg by mouth 2 (two) times daily as needed (headaches.). Patient not taking: Reported on 11/01/2023    [provider]  UNABLE TO FIND Testosterone -pellet every 4 months Bioidentical hormones    [provider]    Allergies as of 11/02/2023 - Review Complete 11/01/2023  Allergen Reaction Noted   Sulfa antibiotics Swelling 12/05/2012    Family History  Problem Relation Age of Onset   Asthma Mother    Coronary artery disease Paternal Grandfather    Cancer Paternal Grandfather        bladder   Cancer Paternal Grandmother        lung   Coronary artery disease Paternal Grandmother    Coronary artery disease Maternal Grandmother    Coronary artery disease Maternal Grandfather    Hypertension Father    Hypertension Other    Stroke Other    Cancer Other        lung, cervical    Coronary artery disease Other     Social History   Socioeconomic History   Marital status: Married    Spouse name: Not on file   Number of children: Not on file   Years of education: Not on file   Highest education level: Not on file  Occupational History   Not on file  Tobacco Use   Smoking status: Never   Smokeless tobacco: Never  Vaping Use   Vaping status: Never Used  Substance and Sexual  Activity   Alcohol use: Yes    Comment: occ.   Drug use: No   Sexual activity: Yes    Birth control/protection: Condom  Other Topics Concern   Not on file  Social History Narrative   Not on file   Social Drivers of Health   Financial Resource Strain: Low Risk  (04/03/2023)   Overall Financial Resource Strain (CARDIA)    Difficulty of Paying Living Expenses: Not very hard  Food Insecurity: No Food Insecurity (04/03/2023)   Hunger Vital Sign    Worried About Running Out of Food in the Last Year: Never true    Ran Out of Food in the Last Year: Never true  Transportation Needs: No Transportation Needs (04/03/2023)   PRAPARE -  Administrator, Civil Service (Medical): No    Lack of Transportation (Non-Medical): No  Physical Activity: Insufficiently Active (04/03/2023)   Exercise Vital Sign    Days of Exercise per Week: 1 day    Minutes of Exercise per Session: 20 min  Stress: Stress Concern Present (04/03/2023)   [employer] of Occupational Health - Occupational Stress Questionnaire    Feeling of Stress : To some extent  Social Connections: Moderately Integrated (04/03/2023)   Social Connection and Isolation Panel [NHANES]    Frequency of Communication with Friends and Family: More than three times a week    Frequency of Social Gatherings with Friends and Family: Twice a week    Attends Religious Services: More than 4 times per year    Active Member of [employer] or Organizations: No    Attends Banker Meetings: Never    Marital Status: Married  Catering manager Violence: Not At Risk (04/03/2023)   Humiliation, Afraid, Rape, and Kick questionnaire    Fear of Current or Ex-Partner: No    Emotionally Abused: No    Physically Abused: No    Sexually Abused: No    Review of Systems: See HPI, otherwise negative ROS  Physical Exam: Vital signs in last 24 hours: Temp:  [98.2 F (36.8 C)] 98.2 F (36.8 C) (04/28 0702) Pulse Rate:  [94] 94 (04/28 0702) Resp:  [16] 16 (04/28 0702) BP: (110)/(95) 110/95 (04/28 0702) SpO2:  [95 %] 95 % (04/28 0702) Weight:  [67.1 kg] 67.1 kg (04/28 0702)   General:   Alert,  Well-developed, well-nourished, pleasant and cooperative in NAD Head:  Normocephalic and atraumatic. Eyes:  Sclera clear, no icterus.   Conjunctiva pink. Ears:  Normal auditory acuity. Nose:  No deformity, discharge,  or lesions. Msk:  Symmetrical without gross deformities. Normal posture. Extremities:  Without clubbing or edema. Neurologic:  Alert and  oriented x4;  grossly normal neurologically. Skin:  Intact without significant lesions or rashes. Psych:  Alert and cooperative.  Normal mood and affect.  Impression/Plan: Ashley Jarvis is here for a colonoscopy to be performed for colon cancer screening purposes.  The risks of the procedure including infection, bleed, or perforation as well as benefits, limitations, alternatives and imponderables have been reviewed with the patient. Questions have been answered. All parties agreeable.

## 2023-12-11 NOTE — Anesthesia Postprocedure Evaluation (Signed)
 Anesthesia Post Note  Patient: Ashley Jarvis  Procedure(s) Performed: COLONOSCOPY  Patient location during evaluation: PACU Anesthesia Type: General Level of consciousness: awake and alert Pain management: pain level controlled Vital Signs Assessment: post-procedure vital signs reviewed and stable Respiratory status: spontaneous breathing, nonlabored ventilation, respiratory function stable and patient connected to nasal cannula oxygen Cardiovascular status: blood pressure returned to baseline and stable Postop Assessment: no apparent nausea or vomiting Anesthetic complications: no   There were no known notable events for this encounter.   Last Vitals:  Vitals:   12/11/23 0754 12/11/23 0757  BP: (!) 84/47 (!) 106/93  Pulse: 80 97  Resp: 20 16  Temp: (!) 36.4 C   SpO2: 97% 96%    Last Pain:  Vitals:   12/11/23 0757  TempSrc:   PainSc: 0-No pain                 Kayren Holck L Marveline Profeta

## 2023-12-11 NOTE — Op Note (Signed)
 [facility] Patient Name: Ashley Jarvis Procedure Date: 12/11/2023 7:04 AM MRN: [id number] Date of Birth: 1971-04-29 Attending MD: Terril Fetters , MD, [id number] CSN: [id number] Age: 53 Admit Type: Outpatient Procedure:                Colonoscopy Indications:              Screening for colorectal malignant neoplasm Providers:                Terril Fetters, MD, Crystal Page, Kristine L. Roberta Chin, Technician Referring MD:              Medicines:                Monitored Anesthesia Care Complications:            No immediate complications. Estimated Blood Loss:     Estimated blood loss: none. Procedure:                After obtaining informed consent, the colonoscope                            was passed under direct vision. Throughout the                            procedure, the patient's blood pressure, pulse, and                            oxygen saturations were monitored continuously. The                            [id number]) scope was introduced through the                            anus and advanced to the the cecum, identified by                            appendiceal orifice and ileocecal valve. The                            colonoscopy was performed without difficulty. The                            patient tolerated the procedure well. The quality                            of the bowel preparation was evaluated using the                            BBPS [facility] Bowel Preparation Scale) with scores                            of: Right Colon = 3, Transverse Colon = 3 and Left  Colon = 3 (entire mucosa seen well with no residual                            staining, small fragments of stool or opaque                            liquid). The total BBPS score equals 9. The                            ileocecal valve, appendiceal orifice, and rectum                            were photographed. Scope In: 7:33:11  AM Scope Out: 7:50:58 AM Scope Withdrawal Time: 0 hours 13 minutes 35 seconds  Total Procedure Duration: 0 hours 17 minutes 47 seconds  Findings:      The perianal and digital rectal examinations were normal.      A 4 mm polyp was found in the cecum. The polyp was sessile. The polyp       was removed with a cold snare. Resection and retrieval were complete.      Non-bleeding internal hemorrhoids were found during retroflexion. The       hemorrhoids were small. Impression:               - One 4 mm polyp in the cecum, removed with a cold                            snare. Resected and retrieved.                           - Non-bleeding internal hemorrhoids. Moderate Sedation:      Per Anesthesia Care Recommendation:           - Patient has a contact number available for                            emergencies. The signs and symptoms of potential                            delayed complications were discussed with the                            patient. Return to normal activities tomorrow.                            Written discharge instructions were provided to the                            patient.                           - Resume previous diet.                           - Continue present medications.                           -  Await pathology results.                           - Repeat colonoscopy in 7-10 years for surveillance.                           - Return to primary care physician as previously                            scheduled. Procedure Code(s):        --- Professional ---                           [phone], Colonoscopy, flexible; with removal of                            tumor(s), polyp(s), or other lesion(s) by snare                            technique Diagnosis Code(s):        --- Professional ---                           Z12.11, Encounter for screening for malignant                            neoplasm of colon                           D12.0, Benign neoplasm of  cecum                           K64.8, Other hemorrhoids CPT copyright 2022 American Medical Association. All rights reserved. The codes documented in this report are preliminary and upon coder review may  be revised to meet current compliance requirements. Terril Fetters, MD Terril Fetters, MD 12/11/2023 8:02:08 AM This report has been signed electronically. Number of Addenda: 0

## 2023-12-11 NOTE — Anesthesia Procedure Notes (Signed)
 Date/Time: 12/11/2023 7:26 AM  Performed by: Sherwin Donate, CRNAPre-anesthesia Checklist: Patient identified, Emergency Drugs available, Suction available and Patient being monitored Patient Re-evaluated:Patient Re-evaluated prior to induction Oxygen Delivery Method: Nasal cannula Induction Type: IV induction Placement Confirmation: positive ETCO2

## 2023-12-11 NOTE — Anesthesia Preprocedure Evaluation (Signed)
 Anesthesia Evaluation  Patient identified by MRN, date of birth, ID band Patient awake    Reviewed: Allergy & Precautions, H&P , NPO status , Patient's Chart, lab work & pertinent test results, reviewed documented beta blocker date and time   Airway Mallampati: I  TM Distance: >3 FB Neck ROM: Full    Dental no notable dental hx. (+) Teeth Intact, Dental Advisory Given   Pulmonary neg pulmonary ROS   Pulmonary exam normal breath sounds clear to auscultation       Cardiovascular Exercise Tolerance: Good negative cardio ROS Normal cardiovascular exam Rhythm:Regular Rate:Normal     Neuro/Psych negative neurological ROS  negative psych ROS   GI/Hepatic Neg liver ROS,GERD  ,,  Endo/Other  Hypothyroidism    Renal/GU negative Renal ROS  negative genitourinary   Musculoskeletal   Abdominal   Peds  Hematology negative hematology ROS (+)   Anesthesia Other Findings Braces  Reproductive/Obstetrics negative OB ROS                             Anesthesia Physical Anesthesia Plan  ASA: 2  Anesthesia Plan: General   Post-op Pain Management: Minimal or no pain anticipated   Induction: Intravenous  PONV Risk Score and Plan: Propofol  infusion  Airway Management Planned: Nasal Cannula and Natural Airway  Additional Equipment: None  Intra-op Plan:   Post-operative Plan:   Informed Consent: I have reviewed the patients History and Physical, chart, labs and discussed the procedure including the risks, benefits and alternatives for the proposed anesthesia with the patient or authorized representative who has indicated his/her understanding and acceptance.     Dental advisory given  Plan Discussed with: CRNA  Anesthesia Plan Comments:         Anesthesia Quick Evaluation

## 2023-12-11 NOTE — Discharge Instructions (Signed)

## 2023-12-12 ENCOUNTER — Encounter (HOSPITAL_COMMUNITY): Payer: Self-pay | Admitting: Gastroenterology

## 2023-12-12 LAB — SURGICAL PATHOLOGY

## 2023-12-13 NOTE — Progress Notes (Signed)
 I reviewed the pathology results. Ann, can you send her a letter with the findings as described below please? Repeat colonoscopy in 5 years  Thanks,  Lucky Alverson Faizan Murriel Holwerda, MD Gastroenterology and Hepatology [facility] Gastroenterology  ---------------------------------------------------------------------------------------------  [facility] [address], [address], [place], [place] [address] Phone:  [phone]   12/13/23 [place]ene Dais, [place]   Dear Zadie Herter,  I am writing to inform you that the biopsies taken during your recent endoscopic examination showed:   A. COLON, CECUM, POLYPECTOMY:  - Sessile serrated polyp.  - No dysplasia or malignancy.    What does this mean?   You had a total of 1 polyps removed. The pathology came back as "sessile serrated" polyp.These findings are NOT cancer, but had the polyps remained in your colon, they could have turn into cancer.  Given these findings, it is recommended that your next colonoscopy be performed in 5 years.  Also I value your feedback , so if you get a survey , please take the time to fill it out and thank you for choosing [facility]  Please call us  at [phone] if you have persistent problems or have questions about your condition that have not been fully answered at this time.  Sincerely,  Braxden Lovering Faizan Jeanmarie Mccowen, MD Gastroenterology and Hepatology

## 2023-12-19 ENCOUNTER — Encounter (INDEPENDENT_AMBULATORY_CARE_PROVIDER_SITE_OTHER): Payer: Self-pay | Admitting: *Deleted

## 2024-02-02 DIAGNOSIS — G43009 Migraine without aura, not intractable, without status migrainosus: Secondary | ICD-10-CM | POA: Diagnosis not present

## 2024-02-08 DIAGNOSIS — H3540 Unspecified peripheral retinal degeneration: Secondary | ICD-10-CM | POA: Diagnosis not present

## 2024-04-03 DIAGNOSIS — N951 Menopausal and female climacteric states: Secondary | ICD-10-CM | POA: Diagnosis not present

## 2024-04-03 DIAGNOSIS — R6882 Decreased libido: Secondary | ICD-10-CM | POA: Diagnosis not present

## 2024-04-03 DIAGNOSIS — E559 Vitamin D deficiency, unspecified: Secondary | ICD-10-CM | POA: Diagnosis not present

## 2024-04-03 DIAGNOSIS — R5381 Other malaise: Secondary | ICD-10-CM | POA: Diagnosis not present

## 2024-05-13 DIAGNOSIS — Z86018 Personal history of other benign neoplasm: Secondary | ICD-10-CM | POA: Diagnosis not present

## 2024-05-13 DIAGNOSIS — L820 Inflamed seborrheic keratosis: Secondary | ICD-10-CM | POA: Diagnosis not present

## 2024-05-13 DIAGNOSIS — D2262 Melanocytic nevi of left upper limb, including shoulder: Secondary | ICD-10-CM | POA: Diagnosis not present

## 2024-05-13 DIAGNOSIS — L821 Other seborrheic keratosis: Secondary | ICD-10-CM | POA: Diagnosis not present

## 2024-05-13 DIAGNOSIS — L570 Actinic keratosis: Secondary | ICD-10-CM | POA: Diagnosis not present

## 2024-05-13 DIAGNOSIS — D225 Melanocytic nevi of trunk: Secondary | ICD-10-CM | POA: Diagnosis not present

## 2024-05-29 ENCOUNTER — Encounter (INDEPENDENT_AMBULATORY_CARE_PROVIDER_SITE_OTHER): Payer: Self-pay | Admitting: Gastroenterology

## 2024-07-15 DIAGNOSIS — G43009 Migraine without aura, not intractable, without status migrainosus: Secondary | ICD-10-CM | POA: Diagnosis not present

## 2024-07-24 DIAGNOSIS — G43009 Migraine without aura, not intractable, without status migrainosus: Secondary | ICD-10-CM | POA: Diagnosis not present

## 2024-07-24 DIAGNOSIS — E039 Hypothyroidism, unspecified: Secondary | ICD-10-CM | POA: Diagnosis not present

## 2024-07-24 DIAGNOSIS — Z131 Encounter for screening for diabetes mellitus: Secondary | ICD-10-CM | POA: Diagnosis not present

## 2024-07-24 DIAGNOSIS — Z13220 Encounter for screening for lipoid disorders: Secondary | ICD-10-CM | POA: Diagnosis not present
